# Patient Record
Sex: Female | Born: 1970 | Race: Black or African American | Hispanic: No | Marital: Single | Smoking: Never smoker
Health system: Southern US, Community
[De-identification: ages and names within clinical notes are randomized; demographics above are authoritative.]

## PROBLEM LIST (undated history)

## (undated) DIAGNOSIS — Z87440 Personal history of urinary (tract) infections: Secondary | ICD-10-CM

## (undated) DIAGNOSIS — K219 Gastro-esophageal reflux disease without esophagitis: Secondary | ICD-10-CM

## (undated) DIAGNOSIS — R030 Elevated blood-pressure reading, without diagnosis of hypertension: Secondary | ICD-10-CM

## (undated) HISTORY — DX: Personal history of urinary (tract) infections: Z87.440

## (undated) HISTORY — PX: ABLATION: SHX5711

## (undated) HISTORY — PX: DILATION AND CURETTAGE OF UTERUS: SHX78

## (undated) HISTORY — PX: OTHER SURGICAL HISTORY: SHX169

## (undated) HISTORY — PX: TUBAL LIGATION: SHX77

## (undated) HISTORY — DX: Elevated blood-pressure reading, without diagnosis of hypertension: R03.0

---

## 2000-04-26 DIAGNOSIS — O039 Complete or unspecified spontaneous abortion without complication: Secondary | ICD-10-CM

## 2000-04-26 HISTORY — DX: Complete or unspecified spontaneous abortion without complication: O03.9

## 2004-10-29 ENCOUNTER — Emergency Department: Payer: Self-pay | Admitting: Emergency Medicine

## 2007-07-31 ENCOUNTER — Emergency Department: Payer: Self-pay | Admitting: Emergency Medicine

## 2007-11-30 ENCOUNTER — Emergency Department: Payer: Self-pay | Admitting: Emergency Medicine

## 2009-01-13 ENCOUNTER — Emergency Department: Payer: Self-pay | Admitting: Emergency Medicine

## 2009-10-30 ENCOUNTER — Encounter: Payer: Self-pay | Admitting: Obstetrics and Gynecology

## 2009-12-01 ENCOUNTER — Encounter: Payer: Self-pay | Admitting: Maternal & Fetal Medicine

## 2010-01-12 ENCOUNTER — Encounter: Payer: Self-pay | Admitting: Obstetrics and Gynecology

## 2010-01-17 ENCOUNTER — Observation Stay: Payer: Self-pay | Admitting: Obstetrics & Gynecology

## 2010-01-26 ENCOUNTER — Observation Stay: Payer: Self-pay | Admitting: Obstetrics and Gynecology

## 2010-02-26 ENCOUNTER — Encounter: Payer: Self-pay | Admitting: Obstetrics and Gynecology

## 2010-03-03 ENCOUNTER — Observation Stay: Payer: Self-pay | Admitting: Obstetrics & Gynecology

## 2010-03-05 ENCOUNTER — Inpatient Hospital Stay: Payer: Self-pay | Admitting: Obstetrics and Gynecology

## 2010-03-26 ENCOUNTER — Encounter: Payer: Self-pay | Admitting: Maternal & Fetal Medicine

## 2011-03-02 ENCOUNTER — Ambulatory Visit: Payer: Self-pay | Admitting: Family Medicine

## 2012-02-19 IMAGING — US US OB >= 14 WKS - NRPT
1 series · 14 of 28 positions shown · non-contrast
Comparison: none

[Series 1: us ob >= 14 wks - nrpt · 14 of 43 slices shown]
[im 2/43]
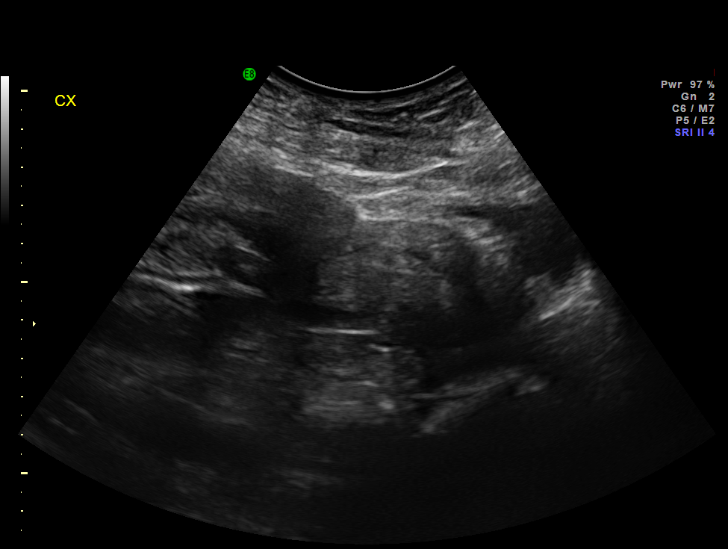
[im 5/43]
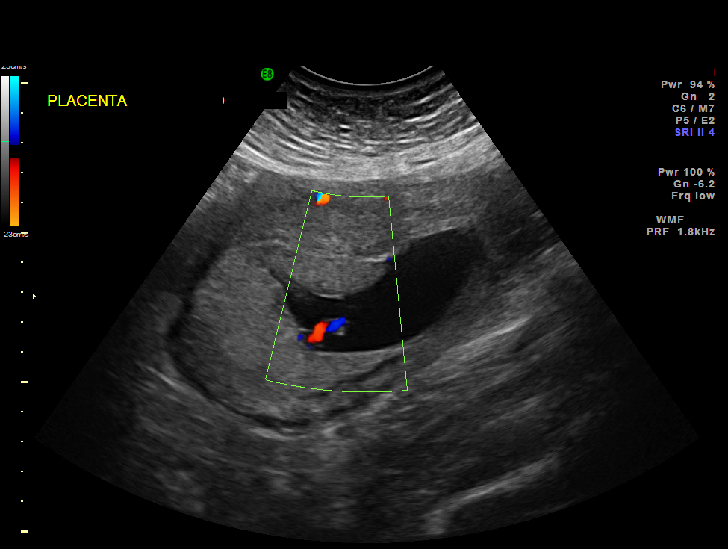
[im 8/43]
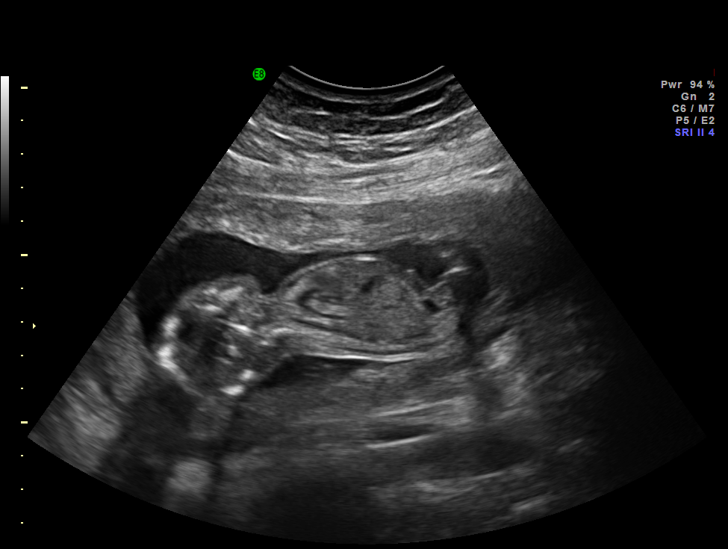
[im 11/43]
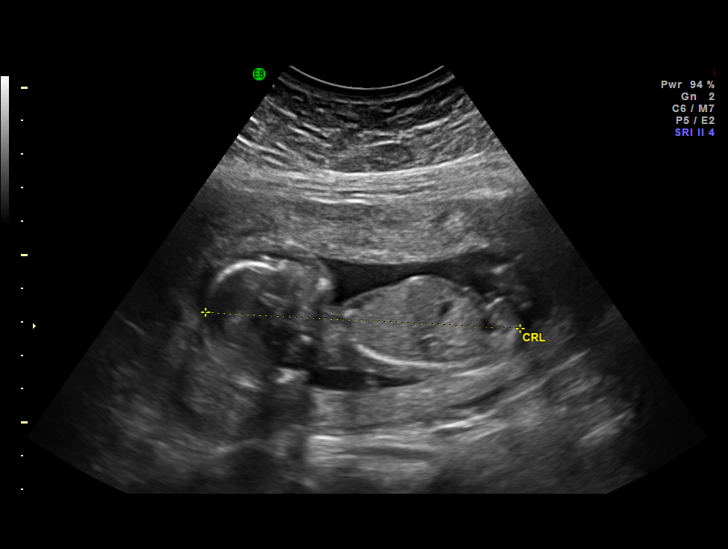
[im 15/43]
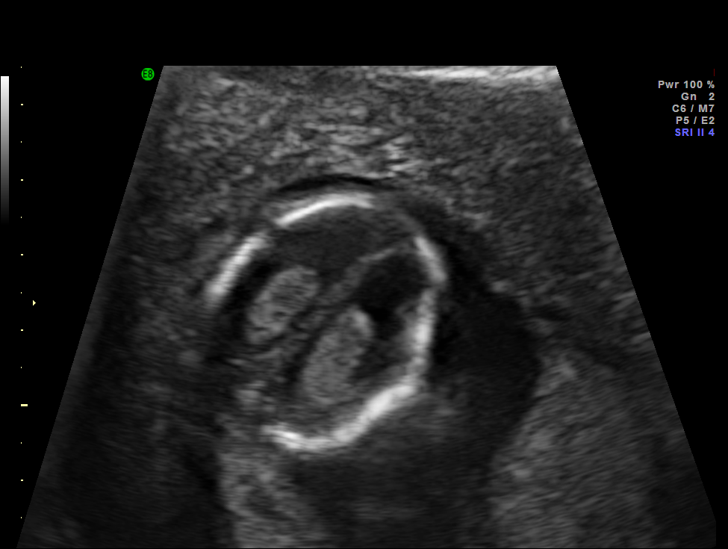
[im 18/43]
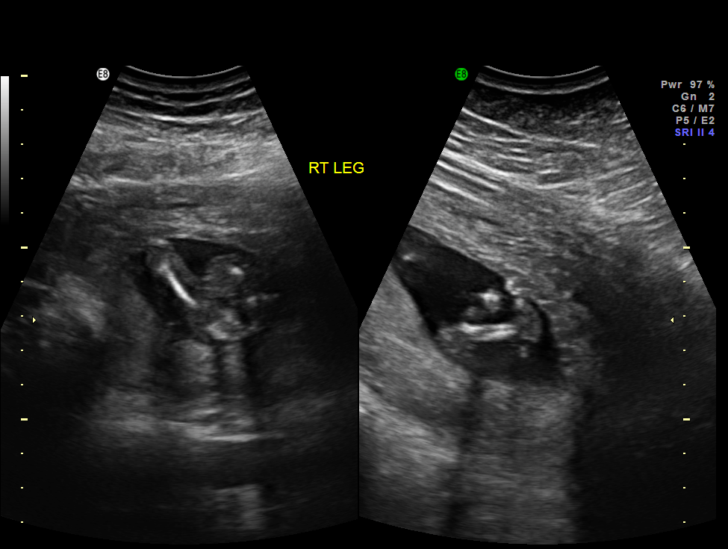
[im 21/43]
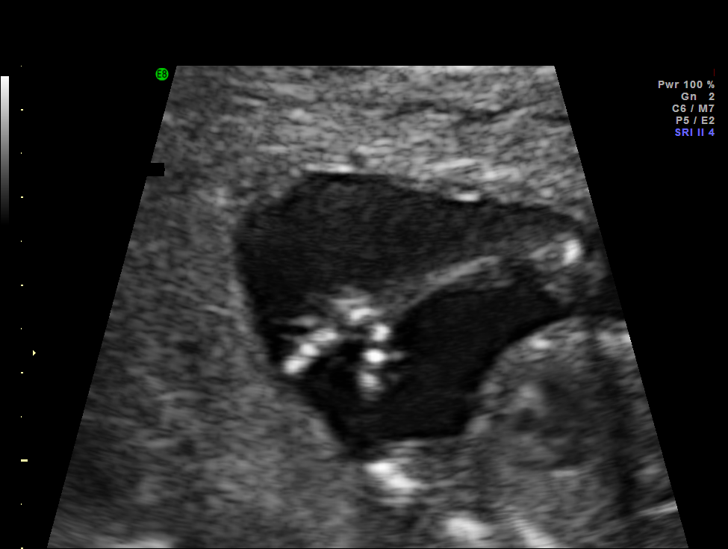
[im 24/43]
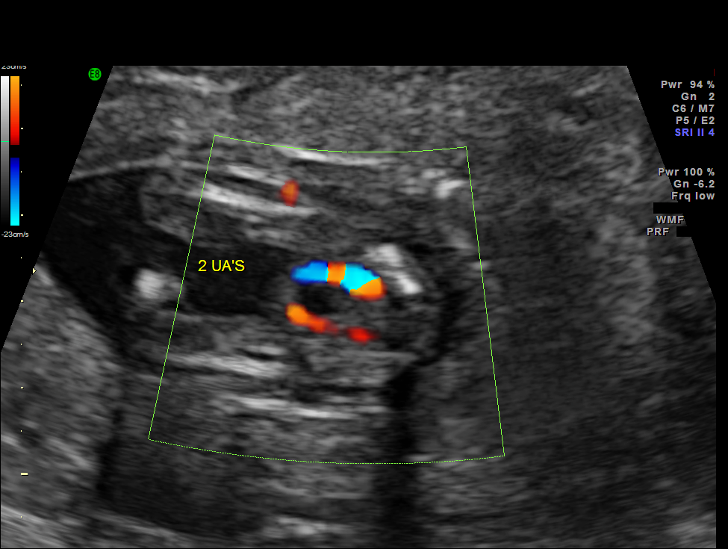
[im 27/43]
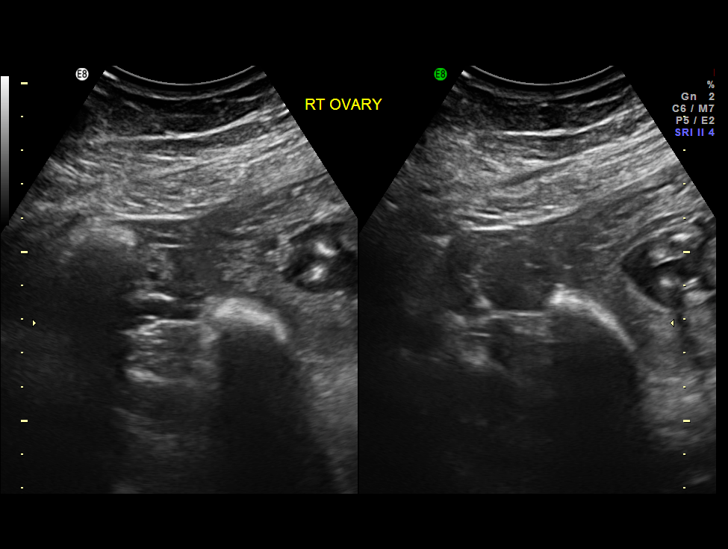
[im 30/43]
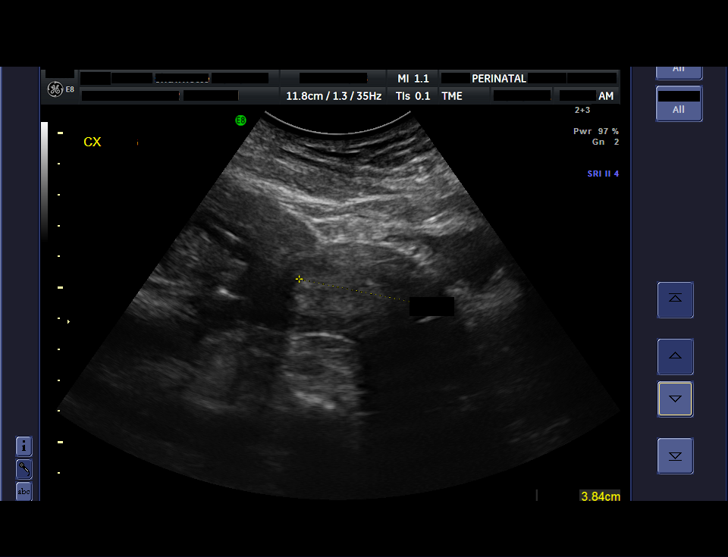
[im 33/43]
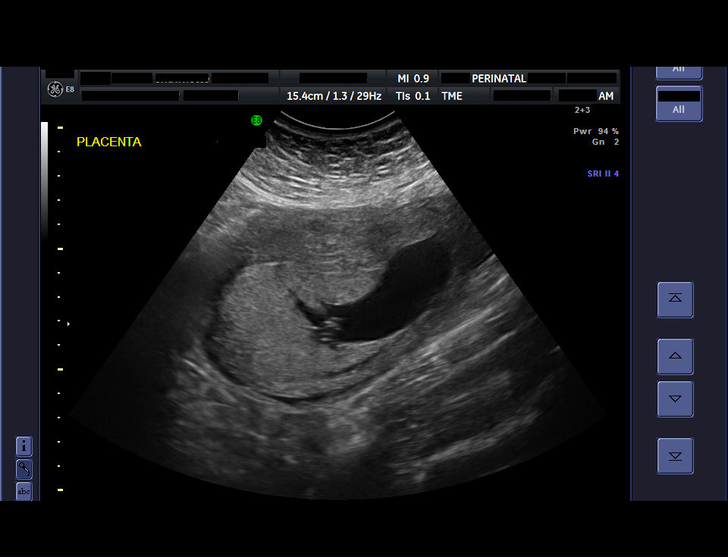
[im 36/43]
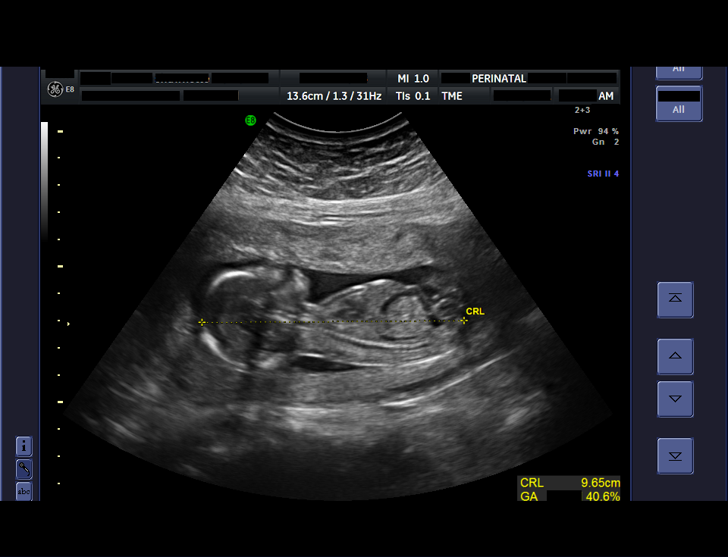
[im 39/43]
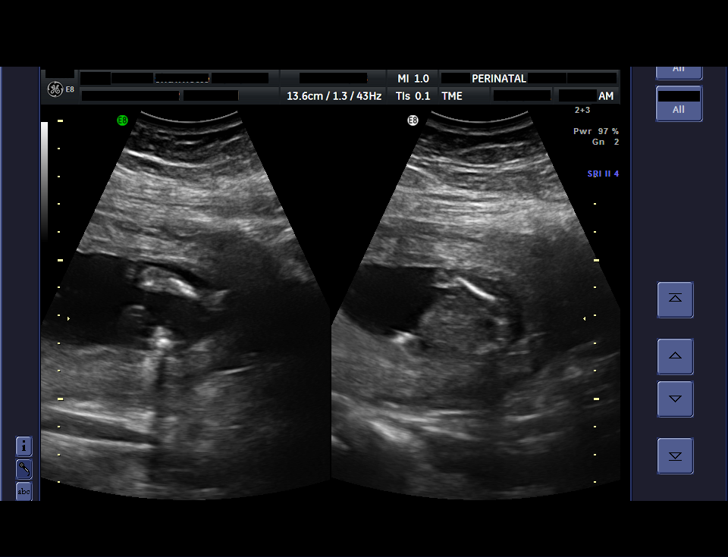
[im 43/43]
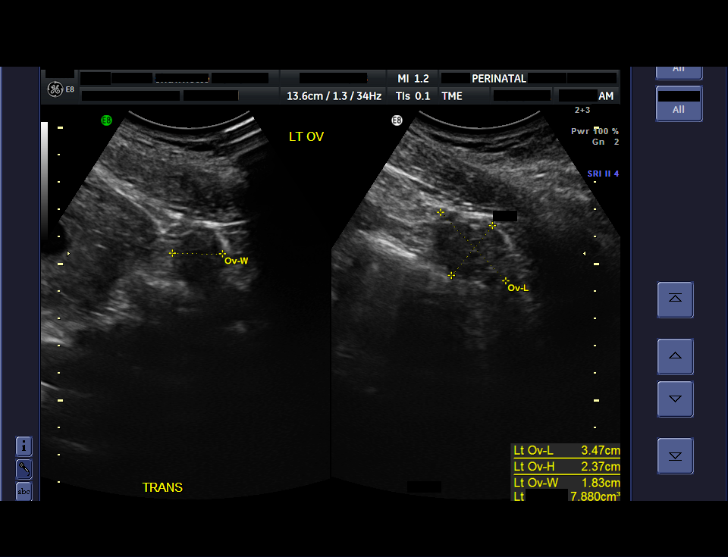

[14 of 28 positions shown; findings below may reference images not displayed]

IMAGES IMPORTED FROM THE [employer] WORKFLOW SYSTEM
NO DICTATION FOR STUDY

## 2013-09-19 ENCOUNTER — Ambulatory Visit: Payer: Self-pay | Admitting: Obstetrics and Gynecology

## 2013-09-19 LAB — CBC
HCT: 39.6 % (ref 35.0–47.0)
HGB: 12.8 g/dL (ref 12.0–16.0)
MCH: 30.9 pg (ref 26.0–34.0)
MCHC: 32.4 g/dL (ref 32.0–36.0)
MCV: 95 fL (ref 80–100)
PLATELETS: 180 10*3/uL (ref 150–440)
RBC: 4.16 10*6/uL (ref 3.80–5.20)
RDW: 13.6 % (ref 11.5–14.5)
WBC: 6 10*3/uL (ref 3.6–11.0)

## 2013-09-28 ENCOUNTER — Ambulatory Visit: Payer: Self-pay | Admitting: Obstetrics and Gynecology

## 2013-10-01 LAB — PATHOLOGY REPORT

## 2013-11-19 ENCOUNTER — Ambulatory Visit: Payer: Self-pay | Admitting: Obstetrics and Gynecology

## 2013-11-23 ENCOUNTER — Ambulatory Visit: Payer: Self-pay | Admitting: Obstetrics and Gynecology

## 2014-08-17 NOTE — Op Note (Signed)
PATIENT NAME:  Adrienne [name], Adrienne [name] MR#:  [id number] DATE OF BIRTH:  02/19/1971  DATE OF PROCEDURE:  11/23/2013  PREOPERATIVE DIAGNOSIS: Abnormal uterine bleeding.   POSTOPERATIVE DIAGNOSIS:  Abnormal uterine bleeding.  OPERATION PERFORMED: Hysteroscopy, NovaSure endometrial ablation.   ANESTHESIA USED: General.   PRIMARY SURGEON: Florina OuAndreas M. Bonney AidStaebler, MD.   PREOPERATIVE ANTIBIOTICS: None.   ESTIMATED BLOOD LOSS: Minimal.   OPERATIVE FLUIDS: 500 mL of crystalloid.   URINE OUTPUT: 50 mL of clear urine.   COMPLICATIONS: None.   INTRAOPERATIVE FINDINGS: Normal size anteverted uterus, posterior wall prolapse of the vagina, sounding length 11 cm, cervix 5.5 cm, yielding a cavity length of 5.5 cm, cavity width was noted to be 4.2 cm. The NovaSure device passed cavity assessment. Burn time at a power of 127 with 64 seconds.  Post ablation hysteroscopy revealed good coverage of the endometrial cavity with sparing of the endocervix.   SPECIMENS REMOVED: None.   CONDITION FOLLOWING PROCEDURE: Stable.   PROCEDURE IN DETAIL: Risks, benefits, and alternatives of the procedure were discussed with the patient prior to proceeding to the operating room. The patient was taken to the operating room and placed under general endotracheal anesthesia. She was positioned in the dorsal lithotomy position, prepped and draped in the usual sterile fashion. A timeout was performed. Attention was then turned to the patient'[name] pelvis. The bladder was straight catheterized with a red blood per catheter. A sterile speculum was then placed. The anterior lip of the cervix was grasped with a single-tooth tenaculum and the cervix sequentially dilated using Pratt dilators. A hysteroscope was then advanced to the cervix, revealing normal intracavitary findings.  The  hysteroscope was removed and used to measure the distance from the cervix to the internal cervical os, which was 5.5 cm. The uterus was sounded with sound to 11 cm. This  gave a cavity length of 5.5 cm A NovaSure device was placed into the endometrial cavity, deployed and measured the cavity width of 4.2 cm. The NovaSure device was activated, passed cavity assessment and was then activated for the actual ablation with a burn time of 64 seconds.   Following the ablation, a 2nd look hysteroscopy was performed noting good coverage of the entire endometrial cavity with sparing of the endocervix. The NovaSure, the sterile speculum and tenaculum were removed. The cervix was noted hemostatic. Sponge and instrument counts were correct x2. The patient tolerated the procedure well and was taken to the recovery room in stable condition.    ____________________________ Florina OuAndreas M. Bonney AidStaebler, MD ams:ds D: 11/27/2013 20:18:38 ET T: 11/27/2013 20:54:57 ET JOB#: [id number]  cc: Florina OuAndreas M. Bonney AidStaebler, MD, <Dictator> Carmel SacramentoANDREAS Cathrine MusterM Atonya Templer MD ELECTRONICALLY SIGNED 12/06/2013 11:40

## 2014-08-17 NOTE — Op Note (Signed)
PATIENT NAME:  Adrienne SalvageRONE, [name] MR#:  [id number] DATE OF BIRTH:  Mar 26, 1971  DATE OF PROCEDURE:  09/28/2013  PREOPERATIVE DIAGNOSIS: Menorrhagia and irregular-appearing endometrial lining.  POSTOPERATIVE  DIAGNOSIS: Menorrhagia and irregular-appearing endometrial lining.  OPERATION PERFORMED: Hysteroscopy, dilatation and curettage.   ANESTHESIA USED: General.   PRIMARY SURGEON: Florina OuAndreas M. Bonney AidStaebler, MD  PREOPERATIVE ANTIBIOTICS: None.   DRAINS OR TUBES: None.   IMPLANTS: None.   ESTIMATED BLOOD LOSS: Minimal.   OPERATIVE FLUIDS: 500 mL of crystalloid.   URINE OUTPUT: 200 mL of clear urine.   COMPLICATIONS: None.   INTRAOPERATIVE FINDINGS: Moderate posterior vaginal wall prolapse. Cervix was identified using vaginoscopy. Normal uterine cavity without evidence of endometrial polyps. Bilateral tubal ostia were visualized. Normal ecto- and endocervix.   SPECIMENS REMOVED: Endometrial curettings.   PATIENT CONDITION FOLLOWING PROCEDURE: Stable.   PROCEDURE IN DETAIL: Risks, benefits and alternatives of the procedure were discussed with the patient prior to proceeding to the operating room. The patient was taken to the operating room, where she was placed under general endotracheal anesthesia. She was positioned in the dorsal lithotomy position utilizing Allen stirrups, prepped and draped in the usual sterile fashion. A timeout procedure was performed. Attention was turned to the patient's pelvis. The bladder was straight catheterized for 500 mL of clear urine. An operative speculum was placed in an attempt to visualize the cervix. However, the posterior wall prolapse precluded clear visualization of the cervix. The hysteroscope was therefore used to find the cervix and was able to enter the cervix without the need of dilation. The operative speculum was then replaced around the hysteroscope, and the anterior lip of the cervix was grasped with a single-tooth tenaculum. Hysteroscopy revealed  normal findings. Following hysteroscopy, a sharp curettage was performed, yielding a moderate amount of tissue. The single-tooth tenaculum and operative speculum were then removed. Sponge, needle and instrument counts were correct x2. The patient tolerated the procedure well and left the operating room in stable condition.   ____________________________ Florina OuAndreas M. Bonney AidStaebler, MD ams:lb D: 09/29/2013 07:51:26 ET T: 09/29/2013 12:00:07 ET JOB#: [id number]  cc: Florina OuAndreas M. Bonney AidStaebler, MD, <Dictator> Carmel SacramentoANDREAS Cathrine MusterM Isrrael Fluckiger MD ELECTRONICALLY SIGNED 10/04/2013 8:30

## 2015-01-27 ENCOUNTER — Encounter: Payer: Self-pay | Admitting: Emergency Medicine

## 2015-01-27 ENCOUNTER — Emergency Department
Admission: EM | Admit: 2015-01-27 | Discharge: 2015-01-27 | Disposition: A | Discharge status: Home / Self Care | Payer: Medicaid Other | Attending: Emergency Medicine | Admitting: Emergency Medicine

## 2015-01-27 DIAGNOSIS — N390 Urinary tract infection, site not specified: Secondary | ICD-10-CM | POA: Diagnosis not present

## 2015-01-27 DIAGNOSIS — R1032 Left lower quadrant pain: Secondary | ICD-10-CM | POA: Diagnosis present

## 2015-01-27 LAB — URINALYSIS COMPLETE WITH MICROSCOPIC (ARMC ONLY)
BILIRUBIN URINE: NEGATIVE
Glucose, UA: NEGATIVE mg/dL
Ketones, ur: NEGATIVE mg/dL
NITRITE: POSITIVE — AB
PH: 7 (ref 5.0–8.0)
PROTEIN: 100 mg/dL — AB
Specific Gravity, Urine: 1.012 (ref 1.005–1.030)
Trans Epithel, UA: 1

## 2015-01-27 LAB — COMPREHENSIVE METABOLIC PANEL
ALBUMIN: 4 g/dL (ref 3.5–5.0)
ALT: 13 U/L — ABNORMAL LOW (ref 14–54)
ANION GAP: 4 — AB (ref 5–15)
AST: 18 U/L (ref 15–41)
Alkaline Phosphatase: 57 U/L (ref 38–126)
BUN: 12 mg/dL (ref 6–20)
CO2: 26 mmol/L (ref 22–32)
Calcium: 9 mg/dL (ref 8.9–10.3)
Chloride: 106 mmol/L (ref 101–111)
Creatinine, Ser: 1.06 mg/dL — ABNORMAL HIGH (ref 0.44–1.00)
GFR calc Af Amer: >60 mL/min (ref >60)
GFR calc non Af Amer: >60 mL/min (ref >60)
GLUCOSE: 100 mg/dL — AB (ref 65–99)
POTASSIUM: 3.8 mmol/L (ref 3.5–5.1)
Sodium: 136 mmol/L (ref 135–145)
Total Bilirubin: 0.7 mg/dL (ref 0.3–1.2)
Total Protein: 8.4 g/dL — ABNORMAL HIGH (ref 6.5–8.1)

## 2015-01-27 LAB — CBC WITH DIFFERENTIAL/PLATELET
Basophils Absolute: 0 10*3/uL (ref 0–0.1)
Basophils Relative: 0 %
Eosinophils Absolute: 0 10*3/uL (ref 0–0.7)
Eosinophils Relative: 0 %
HEMATOCRIT: 41.2 % (ref 35.0–47.0)
Hemoglobin: 13.7 g/dL (ref 12.0–16.0)
LYMPHS ABS: 1.8 10*3/uL (ref 1.0–3.6)
LYMPHS PCT: 16 %
MCH: 31.3 pg (ref 26.0–34.0)
MCHC: 33.3 g/dL (ref 32.0–36.0)
MCV: 93.8 fL (ref 80.0–100.0)
MONO ABS: 1 10*3/uL — AB (ref 0.2–0.9)
Monocytes Relative: 9 %
NEUTROS ABS: 8.3 10*3/uL — AB (ref 1.4–6.5)
Neutrophils Relative %: 75 %
PLATELETS: 158 10*3/uL (ref 150–440)
RBC: 4.4 MIL/uL (ref 3.80–5.20)
RDW: 13.3 % (ref 11.5–14.5)
WBC: 11.1 10*3/uL — ABNORMAL HIGH (ref 3.6–11.0)

## 2015-01-27 LAB — LIPASE, BLOOD: Lipase: 21 U/L — ABNORMAL LOW (ref 22–51)

## 2015-01-27 MED ORDER — NITROFURANTOIN MONOHYD MACRO 100 MG PO CAPS
ORAL_CAPSULE | ORAL | Status: AC
  Filled 2015-01-27: qty 1

## 2015-01-27 MED ORDER — CEFTRIAXONE SODIUM 1 G IJ SOLR
1.0000 g | Freq: Once | INTRAMUSCULAR | Status: AC
  Administered 2015-01-27: 1 g via INTRAMUSCULAR
  Filled 2015-01-27: qty 10

## 2015-01-27 MED ORDER — CEPHALEXIN 500 MG PO CAPS: 500.0000 mg | ORAL_CAPSULE | Freq: Four times a day (QID) | ORAL | Status: AC

## 2015-01-27 MED ORDER — NITROFURANTOIN MONOHYD MACRO 100 MG PO CAPS: 100.0000 mg | ORAL_CAPSULE | Freq: Two times a day (BID) | ORAL | Status: AC

## 2015-01-27 MED ORDER — CEFTRIAXONE SODIUM 1 G IJ SOLR
INTRAMUSCULAR | Status: AC
  Filled 2015-01-27: qty 10

## 2015-01-27 MED ORDER — NITROFURANTOIN MONOHYD MACRO 100 MG PO CAPS
100.0000 mg | ORAL_CAPSULE | Freq: Once | ORAL | Status: AC
  Administered 2015-01-27: 100 mg via ORAL
  Filled 2015-01-27: qty 1

## 2015-01-27 NOTE — ED Notes (Signed)
Reports LLQ pain x 2 days, pain with urination.  Just completed antibiotics (bactrium, and cipro) for uti.  Not better

## 2015-01-27 NOTE — Discharge Instructions (Signed)
Given her recent history, you appear to have a complex urinary tract infection. We discussed different antibiotic choices and agreed on the use of an injection of ceftriaxone and ongoing use of Keflex and Macrobid. Follow up with her regular doctor. You may need to see a urologist as well as. Return to emergency department if you have fever, worsening pain, or other urgent concerns.  Urinary Tract Infection A urinary tract infection (UTI) can occur any place along the urinary tract. The tract includes the kidneys, ureters, bladder, and urethra. A type of germ called bacteria often causes a UTI. UTIs are often helped with antibiotic medicine.  HOME CARE   If given, take antibiotics as told by your doctor. Finish them even if you start to feel better.  Drink enough fluids to keep your pee (urine) clear or pale yellow.  Avoid tea, drinks with caffeine, and bubbly (carbonated) drinks.  Pee often. Avoid holding your pee in for a long time.  Pee before and after having sex (intercourse).  Wipe from front to back after you poop (bowel movement) if you are a woman. Use each tissue only once. GET HELP RIGHT AWAY IF:   You have back pain.  You have lower belly (abdominal) pain.  You have chills.  You feel sick to your stomach (nauseous).  You throw up (vomit).  Your burning or discomfort with peeing does not go away.  You have a fever.  Your symptoms are not better in 3 days. MAKE SURE YOU:   Understand these instructions.  Will watch your condition.  Will get help right away if you are not doing well or get worse. Document Released: 09/29/2007 Document Revised: 01/05/2012 Document Reviewed: 11/11/2011 [facility] Patient Information 2015 [place], [place]. This information is not intended to replace advice given to you by your health care provider. Make sure you discuss any questions you have with your health care provider.

## 2015-01-27 NOTE — ED Provider Notes (Signed)
[facility] Emergency Department Provider Note  ____________________________________________  Time seen: 1555  I have reviewed the triage vital signs and the nursing notes.   HISTORY  Chief Complaint Abdominal Pain  dysuria, lower abdominal discomfort    HPI Adrienne [name] is a 44 y.o. female who presents complaining of discomfort in her left lower abdomen and feeling pressure and discomfort when she urinates. She reports it is worse today after having finished a course of Cipro this past Thursday. The Cipro was used after a short 3 day course on Bactrim. These medications were prescribed and her urinary tract infection was diagnosed by her primary physician, Dr. Letta Pate, at [facility] clinic. Through discussion with the patient, it sounds as though the urine culture showed resistance to Bactrim.  The patient reports she has had urinary tract and pelvic problems for approximately one year since she had an aberration of her uterus. She reports on again and off again in urinary tract infections and yeast infections.  The discomfort today does lateralize a little bit into the left lower abdomen and towards the left flank, but does not include the left back or upper flank. She has no fever or nausea or vomiting.     History reviewed. No pertinent past medical history.  There are no active problems to display for this patient.   Past Surgical History  Procedure Laterality Date  . Tubal ligation      Current Outpatient Rx  Name  Route  Sig  Dispense  Refill  . cephALEXin (KEFLEX) 500 MG capsule   Oral   Take 1 capsule (500 mg total) by mouth 4 (four) times daily.   40 capsule   0   . nitrofurantoin, macrocrystal-monohydrate, (MACROBID) 100 MG capsule   Oral   Take 1 capsule (100 mg total) by mouth 2 (two) times daily.   14 capsule   0     Allergies Review of patient's allergies indicates no known allergies.  History reviewed. No pertinent family  history.  Social History Social History  Substance Use Topics  . Smoking status: Never Smoker   . Smokeless tobacco: None  . Alcohol Use: Yes    Review of Systems  Constitutional: Negative for fever. ENT: Negative for sore throat. Cardiovascular: Negative for chest pain. Respiratory: Negative for shortness of breath. Gastrointestinal: Negative for abdominal pain, vomiting and diarrhea. Genitourinary: Positive for dysuria Musculoskeletal: No myalgias or injuries. Skin: Negative for rash. Neurological: Negative for headaches   10-point ROS otherwise negative.  ____________________________________________   PHYSICAL EXAM:  VITAL SIGNS: ED Triage Vitals  Enc Vitals Group     BP 01/27/15 1158 133/75 mmHg     Pulse Rate 01/27/15 1158 97     Resp 01/27/15 1158 18     Temp 01/27/15 1158 98.1 F (36.7 C)     Temp src --      SpO2 01/27/15 1158 96 %     Weight 01/27/15 1158 210 lb (95.255 kg)     Height 01/27/15 1158  (1.6 m)     Head Cir --      Peak Flow --      Pain Score 01/27/15 1521 10     Pain Loc --      Pain Edu? --      Excl. in GC? --     Constitutional: Alert and oriented. Well appearing and in no distress. ENT   Head: Normocephalic and atraumatic.   Nose: No congestion/rhinnorhea.  Mouth/Throat: Mucous membranes are moist. Cardiovascular: Normal rate, regular rhythm, no murmur noted Respiratory:  Normal respiratory effort, no tachypnea.    Breath sounds are clear and equal bilaterally.  Gastrointestinal: Soft. Mild tenderness in the left lower abdomen and left lateral torso. No distention.  Back: No muscle spasm, no tenderness, no CVA tenderness. Musculoskeletal: No deformity noted. Nontender with normal range of motion in all extremities.  No noted edema. Neurologic:  Normal speech and language. No gross focal neurologic deficits are appreciated.  Skin:  Skin is warm, dry. No rash noted. Psychiatric: Mood and affect are normal. Speech  and behavior are normal.  ____________________________________________    LABS (pertinent positives/negatives)  Labs Reviewed  URINALYSIS COMPLETEWITH MICROSCOPIC ([facility] ONLY) - Abnormal; Notable for the following:    Color, Urine YELLOW (*)    APPearance CLOUDY (*)    Hgb urine dipstick 2+ (*)    Protein, ur 100 (*)    Nitrite POSITIVE (*)    Leukocytes, UA 3+ (*)    Bacteria, UA MANY (*)    Squamous Epithelial / LPF 0-5 (*)    All other components within normal limits  COMPREHENSIVE METABOLIC PANEL - Abnormal; Notable for the following:    Glucose, Bld 100 (*)    Creatinine, Ser 1.06 (*)    Total Protein 8.4 (*)    ALT 13 (*)    Anion gap 4 (*)    All other components within normal limits  CBC WITH DIFFERENTIAL/PLATELET - Abnormal; Notable for the following:    WBC 11.1 (*)    Neutro Abs 8.3 (*)    Monocytes Absolute 1.0 (*)    All other components within normal limits  LIPASE, BLOOD - Abnormal; Notable for the following:    Lipase 21 (*)    All other components within normal limits     ____________________________________________   INITIAL IMPRESSION / ASSESSMENT AND PLAN / ED COURSE  Pertinent labs & imaging results that were available during my care of the patient were reviewed by me and considered in my medical decision making (see chart for details).   This patient has a complex urinary tract infection. It is unclear to me currently why her seven-day course of Cipro has not clear this after, I presume, a urine culture showed that it should be sensitive to Cipro. She does not have any CVA tenderness, no fever, and her white blood cell count is 11,000. She does not appear to have pyelonephritis. We will treat her with an injection of ceftriaxone, ongoing Keflex, as well as Macrobid. I discussed the need to see a urologist with the patient.  ____________________________________________   FINAL CLINICAL IMPRESSION(S) / ED DIAGNOSES  Final diagnoses:  UTI (lower  urinary tract infection)      Darien Ramus, MD 01/27/15 [phone]

## 2015-01-27 NOTE — ED Notes (Signed)
Pt reports pelvic pressure and pain upon urination. Denies fever. Denies nausea and vomiting. Denies diarrhea. Finished antibiotics four days ago, was being treated for UTI. Pt states first took Bactrim and then Cipro. Pt reports finishing all medications.

## 2015-10-20 ENCOUNTER — Other Ambulatory Visit: Payer: Self-pay | Admitting: Gastroenterology

## 2015-10-20 DIAGNOSIS — Z87898 Personal history of other specified conditions: Secondary | ICD-10-CM

## 2015-10-20 DIAGNOSIS — R1013 Epigastric pain: Secondary | ICD-10-CM

## 2015-10-22 ENCOUNTER — Ambulatory Visit
Admission: RE | Admit: 2015-10-22 | Discharge: 2015-10-22 | Disposition: A | Discharge status: Home / Self Care | Payer: Medicaid Other | Source: Ambulatory Visit | Attending: Gastroenterology | Admitting: Gastroenterology

## 2015-10-22 DIAGNOSIS — Z87898 Personal history of other specified conditions: Secondary | ICD-10-CM

## 2015-10-22 DIAGNOSIS — R1013 Epigastric pain: Secondary | ICD-10-CM | POA: Diagnosis not present

## 2016-01-12 ENCOUNTER — Encounter: Admission: RE | Payer: Self-pay | Source: Ambulatory Visit

## 2016-01-12 ENCOUNTER — Ambulatory Visit: Admission: RE | Admit: 2016-01-12 | Payer: Medicaid Other | Source: Ambulatory Visit | Admitting: Gastroenterology

## 2016-01-12 HISTORY — DX: Gastro-esophageal reflux disease without esophagitis: K21.9

## 2016-01-12 SURGERY — ESOPHAGOGASTRODUODENOSCOPY (EGD) WITH PROPOFOL
Anesthesia: General

## 2016-06-11 ENCOUNTER — Emergency Department
Admission: EM | Admit: 2016-06-11 | Discharge: 2016-06-11 | Disposition: A | Discharge status: Home / Self Care | Payer: Medicaid Other | Attending: Emergency Medicine | Admitting: Emergency Medicine

## 2016-06-11 ENCOUNTER — Encounter: Payer: Self-pay | Admitting: Emergency Medicine

## 2016-06-11 DIAGNOSIS — M7602 Gluteal tendinitis, left hip: Secondary | ICD-10-CM | POA: Insufficient documentation

## 2016-06-11 MED ORDER — IBUPROFEN 600 MG PO TABS: 600.0000 mg | ORAL_TABLET | Freq: Three times a day (TID) | ORAL | 0 refills | Status: DC | PRN

## 2016-06-11 MED ORDER — CYCLOBENZAPRINE HCL 10 MG PO TABS: 10.0000 mg | ORAL_TABLET | Freq: Three times a day (TID) | ORAL | 0 refills | Status: DC | PRN

## 2016-06-11 MED ORDER — TRAMADOL HCL 50 MG PO TABS
50.0000 mg | ORAL_TABLET | Freq: Four times a day (QID) | ORAL | 0 refills | Status: AC | PRN
Start: 2016-06-11 — End: 2017-06-11

## 2016-06-11 NOTE — ED Provider Notes (Signed)
[facility] Emergency Department Provider Note   ____________________________________________   None    (approximate)  I have reviewed the triage vital signs and the nursing notes.   HISTORY  Chief Complaint Rectal Pain    HPI Adrienne [name] is a 46 y.o. female patient complain of left buttock pain for 2 weeks. Patient state is tender When she rolled out of bed has not improved. Patient's using over-the-counter icy hot and anti-inflammatory medications. Patient denies any radicular component to this pain. Patient stated pain increases with sitting and return to a standing position. She rates the pain as a 10 over 10. Patient describes pain as a constant ache that increases to sharp with ambulation.   Past Medical History:  Diagnosis Date  . GERD (gastroesophageal reflux disease)     There are no active problems to display for this patient.   Past Surgical History:  Procedure Laterality Date  . ABLATION    . CESAREAN SECTION    . DILATION AND CURETTAGE OF UTERUS    . TUBAL LIGATION      Prior to Admission medications   Medication Sig Start Date End Date Taking? Authorizing Provider  cyclobenzaprine (FLEXERIL) 10 MG tablet Take 1 tablet (10 mg total) by mouth 3 (three) times daily as needed. 06/11/16   Joni Reiningonald K Abbey Veith, PA-C  ibuprofen (ADVIL,MOTRIN) 600 MG tablet Take 1 tablet (600 mg total) by mouth every 8 (eight) hours as needed. 06/11/16   Joni Reiningonald K Carmita Boom, PA-C  medroxyPROGESTERone (DEPO-PROVERA) 150 MG/ML injection Inject 150 mg into the muscle every 3 (three) months.    Historical Provider, MD  RABEprazole (ACIPHEX) 20 MG tablet Take 20 mg by mouth 2 (two) times daily before a meal.    Historical Provider, MD  sucralfate (CARAFATE) 1 g tablet Take 1 g by mouth 4 (four) times daily.    Historical Provider, MD  traMADol (ULTRAM) 50 MG tablet Take 1 tablet (50 mg total) by mouth every 6 (six) hours as needed. 06/11/16 06/11/17  Joni Reiningonald K Caelan Branden, PA-C     Allergies Patient has no known allergies.  No family history on file.  Social History Social History  Substance Use Topics  . Smoking status: Never Smoker  . Smokeless tobacco: Never Used  . Alcohol use Yes    Review of Systems Constitutional: No fever/chills Eyes: No visual changes. ENT: No sore throat. Cardiovascular: Denies chest pain. Respiratory: Denies shortness of breath. Gastrointestinal: No abdominal pain.  No nausea, no vomiting.  No diarrhea.  No constipation. Genitourinary: Negative for dysuria. Musculoskeletal:Buttocks pain Skin: Negative for rash. Neurological: Negative for headaches, focal weakness or numbness.    ____________________________________________   PHYSICAL EXAM:  VITAL SIGNS: ED Triage Vitals  Enc Vitals Group     BP 06/11/16 0714 (!) 148/103     Pulse Rate 06/11/16 0714 81     Resp 06/11/16 0714 18     Temp 06/11/16 0714 98.7 F (37.1 C)     Temp Source 06/11/16 0714 Oral     SpO2 06/11/16 0714 95 %     Weight 06/11/16 0710 208 lb (94.3 kg)     Height 06/11/16 0710 5\' 3"  (1.6 m)     Head Circumference --      Peak Flow --      Pain Score 06/11/16 0710 10     Pain Loc --      Pain Edu? --      Excl. in GC? --  Constitutional: Alert and oriented. Well appearing and in no acute distress. Eyes: Conjunctivae are normal. PERRL. EOMI. Head: Atraumatic. Nose: No congestion/rhinnorhea. Mouth/Throat: Mucous membranes are moist.  Oropharynx non-erythematous. Neck: No stridor.  No cervical spine tenderness to palpation. Hematological/Lymphatic/Immunilogical: No cervical lymphadenopathy. Cardiovascular: Normal rate, regular rhythm. Grossly normal heart sounds.  Good peripheral circulation. Respiratory: Normal respiratory effort.  No retractions. Lungs CTAB. Gastrointestinal: Soft and nontender. No distention. No abdominal bruits. No CVA tenderness. Musculoskeletal: Patient is moderate guarding palpation left lateral aspect of the  buttocks. Neurologic:  Normal speech and language. No gross focal neurologic deficits are appreciated. No gait instability. Skin:  Skin is warm, dry and intact. No rash noted. Psychiatric: Mood and affect are normal. Speech and behavior are normal.  ____________________________________________   LABS (all labs ordered are listed, but only abnormal results are displayed)  Labs Reviewed - No data to display ____________________________________________  EKG   ____________________________________________  RADIOLOGY   ____________________________________________   PROCEDURES  Procedure(s) performed: None  Procedures  Critical Care performed: No  ____________________________________________   INITIAL IMPRESSION / ASSESSMENT AND PLAN / ED COURSE  Pertinent labs & imaging results that were available during my care of the patient were reviewed by me and considered in my medical decision making (see chart for details).  Gluteal tendonitis. Patient given discharge care instruction. Patient get a prescription for Vicoprofen and Flexeril. Patient advised follow-up with family doctor condition persists.      ____________________________________________   FINAL CLINICAL IMPRESSION(S) / ED DIAGNOSES  Final diagnoses:  Gluteal tendonitis of left buttock      NEW MEDICATIONS STARTED DURING THIS VISIT:  Discharge Medication List as of 06/11/2016  8:08 AM    START taking these medications   Details  cyclobenzaprine (FLEXERIL) 10 MG tablet Take 1 tablet (10 mg total) by mouth 3 (three) times daily as needed., Starting Fri 06/11/2016, Print    ibuprofen (ADVIL,MOTRIN) 600 MG tablet Take 1 tablet (600 mg total) by mouth every 8 (eight) hours as needed., Starting Fri 06/11/2016, Print    traMADol (ULTRAM) 50 MG tablet Take 1 tablet (50 mg total) by mouth every 6 (six) hours as needed., Starting Fri 06/11/2016, Until Sat 06/11/2017, Print         Note:  This document was  prepared using Dragon voice recognition software and may include unintentional dictation errors.    Joni Reining, PA-C 06/12/16 1818    Charlynne Pander, MD 06/14/16 [phone]

## 2016-06-11 NOTE — ED Triage Notes (Signed)
Patient states, "I think I pulled a muscle in my butt."  Patient reports pain x 2 weeks.  Patient states, "at first I thought I rolled off the bed wrong but it's not getting any better."  Patient points to her left thigh area when describes the pain.  Patient ambulatory to triage with no obvious distress at this time.

## 2016-06-23 LAB — HM PAP SMEAR: HM Pap smear: NEGATIVE

## 2017-08-23 ENCOUNTER — Encounter: Payer: Self-pay | Admitting: Emergency Medicine

## 2017-08-23 ENCOUNTER — Other Ambulatory Visit: Payer: Self-pay

## 2017-08-23 ENCOUNTER — Emergency Department
Admission: EM | Admit: 2017-08-23 | Discharge: 2017-08-23 | Disposition: A | Discharge status: Home / Self Care | Payer: Managed Care, Other (non HMO) | Attending: Emergency Medicine | Admitting: Emergency Medicine

## 2017-08-23 DIAGNOSIS — Z79899 Other long term (current) drug therapy: Secondary | ICD-10-CM | POA: Insufficient documentation

## 2017-08-23 DIAGNOSIS — R197 Diarrhea, unspecified: Secondary | ICD-10-CM

## 2017-08-23 LAB — DIFFERENTIAL
BASOS ABS: 0 10*3/uL (ref 0–0.1)
BLASTS: 0 %
Band Neutrophils: 2 %
Basophils Relative: 0 %
EOS PCT: 4 %
Eosinophils Absolute: 0.1 10*3/uL (ref 0–0.7)
LYMPHS ABS: 1.7 10*3/uL (ref 1.0–3.6)
LYMPHS PCT: 53 %
MONO ABS: 0.7 10*3/uL (ref 0.2–0.9)
MYELOCYTES: 0 %
Metamyelocytes Relative: 0 %
Monocytes Relative: 24 %
Neutro Abs: 0.6 10*3/uL — ABNORMAL LOW (ref 1.4–6.5)
Neutrophils Relative %: 17 %
Other: 0 %
PROMYELOCYTES RELATIVE: 0 %
nRBC: 0 /100 WBC

## 2017-08-23 LAB — CBC
HEMATOCRIT: 45.7 % (ref 35.0–47.0)
HEMOGLOBIN: 15.6 g/dL (ref 12.0–16.0)
MCH: 32 pg (ref 26.0–34.0)
MCHC: 34.1 g/dL (ref 32.0–36.0)
MCV: 93.9 fL (ref 80.0–100.0)
Platelets: 196 10*3/uL (ref 150–440)
RBC: 4.86 MIL/uL (ref 3.80–5.20)
RDW: 13.5 % (ref 11.5–14.5)
WBC: 3.1 10*3/uL — ABNORMAL LOW (ref 3.6–11.0)

## 2017-08-23 LAB — URINALYSIS, COMPLETE (UACMP) WITH MICROSCOPIC
Bilirubin Urine: NEGATIVE
Glucose, UA: NEGATIVE mg/dL
KETONES UR: 5 mg/dL — AB
Leukocytes, UA: NEGATIVE
Nitrite: NEGATIVE
Protein, ur: 30 mg/dL — AB
Specific Gravity, Urine: 1.025 (ref 1.005–1.030)
pH: 5 (ref 5.0–8.0)

## 2017-08-23 LAB — COMPREHENSIVE METABOLIC PANEL
ALBUMIN: 4 g/dL (ref 3.5–5.0)
ALT: 22 U/L (ref 14–54)
ANION GAP: 7 (ref 5–15)
AST: 23 U/L (ref 15–41)
Alkaline Phosphatase: 68 U/L (ref 38–126)
BUN: 9 mg/dL (ref 6–20)
CHLORIDE: 104 mmol/L (ref 101–111)
CO2: 22 mmol/L (ref 22–32)
Calcium: 8.7 mg/dL — ABNORMAL LOW (ref 8.9–10.3)
Creatinine, Ser: 1 mg/dL (ref 0.44–1.00)
GFR calc Af Amer: >60 mL/min (ref >60)
GFR calc non Af Amer: >60 mL/min (ref >60)
Glucose, Bld: 119 mg/dL — ABNORMAL HIGH (ref 65–99)
POTASSIUM: 3.4 mmol/L — AB (ref 3.5–5.1)
SODIUM: 133 mmol/L — AB (ref 135–145)
Total Bilirubin: 0.4 mg/dL (ref 0.3–1.2)
Total Protein: 8.3 g/dL — ABNORMAL HIGH (ref 6.5–8.1)

## 2017-08-23 LAB — LIPASE, BLOOD: Lipase: 30 U/L (ref 11–51)

## 2017-08-23 MED ORDER — ONDANSETRON 4 MG PO TBDP: 4.0000 mg | ORAL_TABLET | Freq: Three times a day (TID) | ORAL | 0 refills | Status: DC | PRN

## 2017-08-23 MED ORDER — SODIUM CHLORIDE 0.9 % IV BOLUS
1000.0000 mL | Freq: Once | INTRAVENOUS | Status: AC
  Administered 2017-08-23: 1000 mL via INTRAVENOUS

## 2017-08-23 MED ORDER — KETOROLAC TROMETHAMINE 30 MG/ML IJ SOLN
15.0000 mg | Freq: Once | INTRAMUSCULAR | Status: AC
Start: 2017-08-23 — End: 2017-08-23
  Administered 2017-08-23: 15 mg via INTRAVENOUS
  Filled 2017-08-23: qty 1

## 2017-08-23 NOTE — ED Notes (Signed)
Patient to room 9 ambulatory.

## 2017-08-23 NOTE — ED Provider Notes (Signed)
[name]  ____________________________________________  Time seen: Approximately 11:52 AM  I have reviewed the triage vital signs and the nursing notes.   HISTORY  Chief Complaint Abdominal Pain and Diarrhea   HPI Adrienne [name] is a 47 y.o. female with history of GERD and chronic abdominal pain who presents for abdominal pain and diarrhea.  Patient reports that her symptoms started 2 days ago.  She had several episodes of watery diarrhea and diffuse cramping abdominal pain.  Yesterday the diarrhea resolved but the abdominal cramping persistent.  The pain is worse every time she eats anything.  She had one episode of vomiting 2 days ago but that has also resolved.  Today she reports that the diarrhea restarted.  She reports 4-6 episodes only today of watery diarrhea.  She has had nausea.  No vomiting, no fever but has had chills.  No recent antibiotic use, no history of C. Difficile.  Patient denies any prior abdominal surgeries.  At this time she is complaining of 10 out of 10 diffuse crampy abdominal pain.  Past Medical History:  Diagnosis Date  . GERD (gastroesophageal reflux disease)      Past Surgical History:  Procedure Laterality Date  . ABLATION    . CESAREAN SECTION    . DILATION AND CURETTAGE OF UTERUS    . TUBAL LIGATION      Prior to Admission medications   Medication Sig Start Date End Date Taking? Authorizing Provider  cyclobenzaprine (FLEXERIL) 10 MG tablet Take 1 tablet (10 mg total) by mouth 3 (three) times daily as needed. 06/11/16   Joni Reining, PA-C  ibuprofen (ADVIL,MOTRIN) 600 MG tablet Take 1 tablet (600 mg total) by mouth every 8 (eight) hours as needed. 06/11/16   Joni Reining, PA-C  medroxyPROGESTERone (DEPO-PROVERA) 150 MG/ML injection Inject 150 mg into the muscle every 3 (three) months.    [provider]  ondansetron (ZOFRAN ODT) 4 MG disintegrating tablet Take 1 tablet (4  mg total) by mouth every 8 (eight) hours as needed for nausea or vomiting. 08/23/17   Don Perking, [place], MD  RABEprazole (ACIPHEX) 20 MG tablet Take 20 mg by mouth 2 (two) times daily before a meal.    [provider]  sucralfate (CARAFATE) 1 g tablet Take 1 g by mouth 4 (four) times daily.    [provider]    Allergies Patient has no known allergies.  No family history on file.  Social History Social History   Tobacco Use  . Smoking status: Never Smoker  . Smokeless tobacco: Never Used  Substance Use Topics  . Alcohol use: Yes  . Drug use: No    Review of Systems  Constitutional: Negative for fever. + chills Eyes: Negative for visual changes. ENT: Negative for sore throat. Neck: No neck pain  Cardiovascular: Negative for chest pain. Respiratory: Negative for shortness of breath. Gastrointestinal: + abdominal pain, vomiting and diarrhea. Genitourinary: Negative for dysuria. Musculoskeletal: Negative for back pain. Skin: Negative for rash. Neurological: Negative for headaches, weakness or numbness. Psych: No SI or HI  ____________________________________________   PHYSICAL EXAM:  VITAL SIGNS: ED Triage Vitals  Enc Vitals Group     BP 08/23/17 0911 (!) 129/93     Pulse Rate 08/23/17 0911 91     Resp 08/23/17 0911 18     Temp 08/23/17 0911 98.1 F (36.7 C)     Temp Source 08/23/17 0911 Oral     SpO2 08/23/17  0911 97 %     Weight --      Height --      Head Circumference --      Peak Flow --      Pain Score 08/23/17 0915 10     Pain Loc --      Pain Edu? --      Excl. in GC? --     Constitutional: Alert and oriented. Well appearing and in no apparent distress. HEENT:      Head: Normocephalic and atraumatic.         Eyes: Conjunctivae are normal. Sclera is non-icteric.       Mouth/Throat: Mucous membranes are moist.       Neck: Supple with no signs of meningismus. Cardiovascular: Regular rate and rhythm. No murmurs, gallops, or rubs.  2+ symmetrical distal pulses are present in all extremities. No JVD. Respiratory: Normal respiratory effort. Lungs are clear to auscultation bilaterally. No wheezes, crackles, or rhonchi.  Gastrointestinal: Soft, tender to palpation mostly on the left quadrants and suprapubic region, and non distended with positive bowel sounds. No rebound or guarding. Genitourinary: No CVA tenderness. Musculoskeletal: Nontender with normal range of motion in all extremities. No edema, cyanosis, or erythema of extremities. Neurologic: Normal speech and language. Face is symmetric. Moving all extremities. No gross focal neurologic deficits are appreciated. Skin: Skin is warm, dry and intact. No rash noted. Psychiatric: Mood and affect are normal. Speech and behavior are normal.  ____________________________________________   LABS (all labs ordered are listed, but only abnormal results are displayed)  Labs Reviewed  COMPREHENSIVE METABOLIC PANEL - Abnormal; Notable for the following components:      Result Value   Sodium 133 (*)    Potassium 3.4 (*)    Glucose, Bld 119 (*)    Calcium 8.7 (*)    Total Protein 8.3 (*)    All other components within normal limits  CBC - Abnormal; Notable for the following components:   WBC 3.1 (*)    All other components within normal limits  URINALYSIS, COMPLETE (UACMP) WITH MICROSCOPIC - Abnormal; Notable for the following components:   Color, Urine YELLOW (*)    APPearance HAZY (*)    Hgb urine dipstick SMALL (*)    Ketones, ur 5 (*)    Protein, ur 30 (*)    Bacteria, UA FEW (*)    All other components within normal limits  DIFFERENTIAL - Abnormal; Notable for the following components:   Neutro Abs 0.6 (*)    All other components within normal limits  URINE CULTURE  C DIFFICILE QUICK SCREEN W PCR REFLEX  LIPASE, BLOOD   ____________________________________________  EKG  none ____________________________________________  RADIOLOGY  none    ____________________________________________   PROCEDURES  Procedure(s) performed: None Procedures Critical Care performed:  None ____________________________________________   INITIAL IMPRESSION / ASSESSMENT AND PLAN / ED COURSE  47 y.o. female with history of GERD and chronic abdominal pain who presents for abdominal pain and diarrhea x 2 days.  Patient is extremely well-appearing, no distress, has normal vital signs, abdomen is soft with mild diffuse tenderness worse on the left quadrants, no Murphy sign, no right lower quadrant tenderness, no rebound or guarding.  Labs showed leukopenia with WBC 3.1. Differential is pending. Normal CMP and lipase. UA showing 5 ketones, few bacteria.  Patient denies any UTI symptoms.  Will wait until culture is back.  Will give fluids, Toradol, and Zofran.  Presentation concerning for viral gastroenteritis.  Will send  for C. difficile if patient has a bowel movement in the emergency room.    _________________________ 2:03 PM on 08/23/2017 -----------------------------------------  Patient feels markedly improved after fluids, Toradol and Zofran.  Abdomen is soft with no tenderness.  Has been in the ED for 5 hours with no further episodes of diarrhea.  She is tolerating p.o.  Will discharge home on Zofran, follow-up with primary care doctor, and supportive care.  As part of my medical decision making, I reviewed the following data within the electronic MEDICAL RECORD NUMBER Nursing notes reviewed and incorporated, Labs reviewed , Notes from prior ED visits and [place] Controlled Substance Database    Pertinent labs & imaging results that were available during my care of the patient were reviewed by me and considered in my medical decision making (see chart for details).    ____________________________________________   FINAL CLINICAL IMPRESSION(S) / ED DIAGNOSES  Final diagnoses:  Diarrhea of presumed infectious origin      NEW MEDICATIONS STARTED  DURING THIS VISIT:  ED Discharge Orders        Ordered    ondansetron (ZOFRAN ODT) 4 MG disintegrating tablet  Every 8 hours PRN     08/23/17 1402       Note:  This document was prepared using Dragon voice recognition software and may include unintentional dictation errors.    Don Perking, [place], MD 08/23/17 [phone]

## 2017-08-23 NOTE — ED Notes (Signed)
Dr. Veronese at bedside 

## 2017-08-23 NOTE — ED Triage Notes (Signed)
Pt to ED via POV c/o abd pain and diarrhea since Sunday. Pt states that when she tries to eat the pain gets worse. Pt denies N/V. Pt is in NAD at this time.

## 2017-08-24 LAB — URINE CULTURE

## 2017-12-05 ENCOUNTER — Emergency Department
Admission: EM | Admit: 2017-12-05 | Discharge: 2017-12-05 | Disposition: A | Discharge status: Home / Self Care | Payer: Managed Care, Other (non HMO) | Attending: Student in an Organized Health Care Education/Training Program | Admitting: Student in an Organized Health Care Education/Training Program

## 2017-12-05 ENCOUNTER — Other Ambulatory Visit: Payer: Self-pay

## 2017-12-05 ENCOUNTER — Encounter: Payer: Self-pay | Admitting: Emergency Medicine

## 2017-12-05 DIAGNOSIS — Y929 Unspecified place or not applicable: Secondary | ICD-10-CM | POA: Diagnosis not present

## 2017-12-05 DIAGNOSIS — S3992XA Unspecified injury of lower back, initial encounter: Secondary | ICD-10-CM | POA: Diagnosis present

## 2017-12-05 DIAGNOSIS — X500XXA Overexertion from strenuous movement or load, initial encounter: Secondary | ICD-10-CM | POA: Diagnosis not present

## 2017-12-05 DIAGNOSIS — Y999 Unspecified external cause status: Secondary | ICD-10-CM | POA: Diagnosis not present

## 2017-12-05 DIAGNOSIS — Y9389 Activity, other specified: Secondary | ICD-10-CM | POA: Diagnosis not present

## 2017-12-05 DIAGNOSIS — S39012A Strain of muscle, fascia and tendon of lower back, initial encounter: Secondary | ICD-10-CM

## 2017-12-05 DIAGNOSIS — B372 Candidiasis of skin and nail: Secondary | ICD-10-CM | POA: Insufficient documentation

## 2017-12-05 DIAGNOSIS — Z79899 Other long term (current) drug therapy: Secondary | ICD-10-CM | POA: Diagnosis not present

## 2017-12-05 MED ORDER — HYDROCODONE-ACETAMINOPHEN 5-325 MG PO TABS: 1.0000 | ORAL_TABLET | Freq: Four times a day (QID) | ORAL | 0 refills | Status: DC | PRN

## 2017-12-05 MED ORDER — METHOCARBAMOL 500 MG PO TABS
1000.0000 mg | ORAL_TABLET | Freq: Once | ORAL | Status: AC
  Administered 2017-12-05: 1000 mg via ORAL
  Filled 2017-12-05: qty 2

## 2017-12-05 MED ORDER — KETOROLAC TROMETHAMINE 30 MG/ML IJ SOLN
30.0000 mg | Freq: Once | INTRAMUSCULAR | Status: AC
  Administered 2017-12-05: 30 mg via INTRAMUSCULAR
  Filled 2017-12-05: qty 1

## 2017-12-05 MED ORDER — METHOCARBAMOL 500 MG PO TABS: 500.0000 mg | ORAL_TABLET | Freq: Four times a day (QID) | ORAL | 0 refills | Status: DC | PRN

## 2017-12-05 MED ORDER — CLOTRIMAZOLE 1 % EX CREA: 1.0000 "application " | TOPICAL_CREAM | Freq: Two times a day (BID) | CUTANEOUS | 0 refills | Status: DC

## 2017-12-05 MED ORDER — IBUPROFEN 600 MG PO TABS: 600.0000 mg | ORAL_TABLET | Freq: Three times a day (TID) | ORAL | 0 refills | Status: DC | PRN

## 2017-12-05 NOTE — ED Notes (Signed)
See triage note  Presents with 2-3 days of lower back pain  States pain is non radiating  Also having a red sore area to lower abd area   No fever

## 2017-12-05 NOTE — Discharge Instructions (Addendum)
Follow-up with your primary care provider if any continued problems.  Use moist heat or ice to your lower back as needed for discomfort.  Begin taking medication as directed.  Also use cream to your rash until completely finished which may take up to 2 to 3 weeks.  Allow area to get plenty of air.
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
## 2017-12-05 NOTE — ED Triage Notes (Signed)
Back pain x 2 days, denies injury. Irritation old scar lower abd. (post c section 7 years.)

## 2018-06-22 ENCOUNTER — Emergency Department
Admission: EM | Admit: 2018-06-22 | Discharge: 2018-06-22 | Disposition: A | Discharge status: Home / Self Care | Payer: Managed Care, Other (non HMO) | Attending: Student in an Organized Health Care Education/Training Program | Admitting: Student in an Organized Health Care Education/Training Program

## 2018-06-22 ENCOUNTER — Encounter: Payer: Self-pay | Admitting: Emergency Medicine

## 2018-06-22 DIAGNOSIS — Y998 Other external cause status: Secondary | ICD-10-CM | POA: Insufficient documentation

## 2018-06-22 DIAGNOSIS — Y92410 Unspecified street and highway as the place of occurrence of the external cause: Secondary | ICD-10-CM | POA: Diagnosis not present

## 2018-06-22 DIAGNOSIS — Y9389 Activity, other specified: Secondary | ICD-10-CM | POA: Insufficient documentation

## 2018-06-22 DIAGNOSIS — M545 Low back pain: Secondary | ICD-10-CM | POA: Insufficient documentation

## 2018-06-22 MED ORDER — CYCLOBENZAPRINE HCL 10 MG PO TABS: 10.0000 mg | ORAL_TABLET | Freq: Three times a day (TID) | ORAL | 0 refills | Status: DC | PRN

## 2018-06-22 MED ORDER — OXYCODONE-ACETAMINOPHEN 5-325 MG PO TABS
1.0000 | ORAL_TABLET | Freq: Once | ORAL | Status: AC
  Administered 2018-06-22: 1 via ORAL
  Filled 2018-06-22: qty 1

## 2018-06-22 MED ORDER — IBUPROFEN 600 MG PO TABS
600.0000 mg | ORAL_TABLET | Freq: Once | ORAL | Status: AC
  Administered 2018-06-22: 600 mg via ORAL
  Filled 2018-06-22: qty 1

## 2018-06-22 MED ORDER — TRAMADOL HCL 50 MG PO TABS: 50.0000 mg | ORAL_TABLET | Freq: Four times a day (QID) | ORAL | 0 refills | Status: AC | PRN

## 2018-06-22 MED ORDER — IBUPROFEN 600 MG PO TABS: 600.0000 mg | ORAL_TABLET | Freq: Three times a day (TID) | ORAL | 0 refills | Status: DC | PRN

## 2018-06-22 MED ORDER — CYCLOBENZAPRINE HCL 10 MG PO TABS
10.0000 mg | ORAL_TABLET | Freq: Once | ORAL | Status: AC
  Administered 2018-06-22: 10 mg via ORAL
  Filled 2018-06-22: qty 1
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
## 2018-06-22 NOTE — ED Notes (Signed)
Pt discharged home after verbalizing understanding of discharge instructions; nad noted. 
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
## 2018-11-08 ENCOUNTER — Encounter: Payer: Self-pay | Admitting: Emergency Medicine

## 2018-11-08 ENCOUNTER — Emergency Department
Admission: EM | Admit: 2018-11-08 | Discharge: 2018-11-08 | Disposition: A | Discharge status: Home / Self Care | Payer: Managed Care, Other (non HMO) | Attending: Emergency Medicine | Admitting: Emergency Medicine

## 2018-11-08 ENCOUNTER — Other Ambulatory Visit: Payer: Self-pay

## 2018-11-08 DIAGNOSIS — R42 Dizziness and giddiness: Secondary | ICD-10-CM | POA: Insufficient documentation

## 2018-11-08 DIAGNOSIS — K219 Gastro-esophageal reflux disease without esophagitis: Secondary | ICD-10-CM | POA: Insufficient documentation

## 2018-11-08 DIAGNOSIS — Z20828 Contact with and (suspected) exposure to other viral communicable diseases: Secondary | ICD-10-CM | POA: Insufficient documentation

## 2018-11-08 DIAGNOSIS — R51 Headache: Secondary | ICD-10-CM | POA: Insufficient documentation

## 2018-11-08 LAB — URINALYSIS, COMPLETE (UACMP) WITH MICROSCOPIC
Bacteria, UA: NONE SEEN
Bilirubin Urine: NEGATIVE
Glucose, UA: NEGATIVE mg/dL
Hgb urine dipstick: NEGATIVE
Ketones, ur: NEGATIVE mg/dL
Leukocytes,Ua: NEGATIVE
Nitrite: NEGATIVE
Protein, ur: NEGATIVE mg/dL
Specific Gravity, Urine: 1.019 (ref 1.005–1.030)
pH: 5 (ref 5.0–8.0)

## 2018-11-08 LAB — HEPATIC FUNCTION PANEL
ALT: 23 U/L (ref 0–44)
AST: 19 U/L (ref 15–41)
Albumin: 3.8 g/dL (ref 3.5–5.0)
Alkaline Phosphatase: 62 U/L (ref 38–126)
Bilirubin, Direct: <0.1 mg/dL (ref 0.0–0.2)
Total Bilirubin: 0.7 mg/dL (ref 0.3–1.2)
Total Protein: 7.4 g/dL (ref 6.5–8.1)

## 2018-11-08 LAB — CBC
HCT: 40.9 % (ref 36.0–46.0)
Hemoglobin: 14.1 g/dL (ref 12.0–15.0)
MCH: 32.4 pg (ref 26.0–34.0)
MCHC: 34.5 g/dL (ref 30.0–36.0)
MCV: 94 fL (ref 80.0–100.0)
Platelets: 197 10*3/uL (ref 150–400)
RBC: 4.35 MIL/uL (ref 3.87–5.11)
RDW: 12.7 % (ref 11.5–15.5)
WBC: 5 10*3/uL (ref 4.0–10.5)
nRBC: 0 % (ref 0.0–0.2)

## 2018-11-08 LAB — BASIC METABOLIC PANEL
Anion gap: 8 (ref 5–15)
BUN: 13 mg/dL (ref 6–20)
CO2: 25 mmol/L (ref 22–32)
Calcium: 9 mg/dL (ref 8.9–10.3)
Chloride: 103 mmol/L (ref 98–111)
Creatinine, Ser: 0.71 mg/dL (ref 0.44–1.00)
GFR calc Af Amer: >60 mL/min (ref >60)
GFR calc non Af Amer: >60 mL/min (ref >60)
Glucose, Bld: 103 mg/dL — ABNORMAL HIGH (ref 70–99)
Potassium: 3.7 mmol/L (ref 3.5–5.1)
Sodium: 136 mmol/L (ref 135–145)

## 2018-11-08 LAB — TROPONIN I (HIGH SENSITIVITY): Troponin I (High Sensitivity): 7 ng/L (ref <18)

## 2018-11-08 LAB — POCT PREGNANCY, URINE: Preg Test, Ur: NEGATIVE

## 2018-11-08 MED ORDER — SODIUM CHLORIDE 0.9 % IV BOLUS
1000.0000 mL | Freq: Once | INTRAVENOUS | Status: AC
  Administered 2018-11-08: 16:00:00 1000 mL via INTRAVENOUS

## 2018-11-08 MED ORDER — SODIUM CHLORIDE 0.9% FLUSH: 3.0000 mL | Freq: Once | INTRAVENOUS | Status: DC

## 2018-11-08 NOTE — ED Provider Notes (Signed)
[facility] Emergency Department Provider Note   ____________________________________________   First MD Initiated Contact with Patient 11/08/18 1512     (approximate)  I have reviewed the triage vital signs and the nursing notes.   HISTORY  Chief Complaint Dizziness and Blurred Vision    HPI Adrienne [name] is a 48 y.o. female who reports that she developed a headache on Monday its bitemporal bad headache but not severe comes and goes along with blurry vision also coming and going usually the blurry vision last only for a few seconds and goes away and returns again.  She has been lightheaded as well.  Not spinning.  She is not running a fever.  She is not coughing.  She did have a little bit of diarrhea yesterday and today.  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  She is not been drinking as much as usual.  She has no other medical problems.         Past Medical History:  Diagnosis Date  . GERD (gastroesophageal reflux disease)     There are no active problems to display for this patient.   Past Surgical History:  Procedure Laterality Date  . ABLATION    . CESAREAN SECTION    . DILATION AND CURETTAGE OF UTERUS    . TUBAL LIGATION      Prior to Admission medications   Medication Sig Start Date End Date Taking? Authorizing Provider  cyclobenzaprine (FLEXERIL) 10 MG tablet Take 1 tablet (10 mg total) by mouth 3 (three) times daily as needed. 06/22/18   Joni ReiningSmith, Ronald K, PA-C  ibuprofen (ADVIL,MOTRIN) 600 MG tablet Take 1 tablet (600 mg total) by mouth every 8 (eight) hours as needed. 06/22/18   Joni ReiningSmith, Ronald K, PA-C  medroxyPROGESTERone (DEPO-PROVERA) 150 MG/ML injection Inject 150 mg into the muscle every 3 (three) months.    [provider]  traMADol (ULTRAM) 50 MG tablet Take 1 tablet (50 mg total) by mouth every 6 (six) hours as needed. 06/22/18 06/22/19  Joni ReiningSmith, Ronald K, PA-C    Allergies Patient has no known  allergies.  History reviewed. No pertinent family history.  Social History Social History   Tobacco Use  . Smoking status: Never Smoker  . Smokeless tobacco: Never Used  Substance Use Topics  . Alcohol use: Yes  . Drug use: No    Review of Systems  Constitutional: No fever/chills Eyes: See HPI ENT: No sore throat. Cardiovascular: Denies chest pain. Respiratory: Denies shortness of breath. Gastrointestinal: No abdominal pain.  No nausea, no vomiting.  No diarrhea.  No constipation. Genitourinary: Negative for dysuria. Musculoskeletal: Negative for back pain. Skin: Negative for rash. Neurological: Negative for  focal weakness   ____________________________________________   PHYSICAL EXAM:  VITAL SIGNS: ED Triage Vitals  Enc Vitals Group     BP 11/08/18 1211 131/86     Pulse Rate 11/08/18 1211 71     Resp 11/08/18 1211 16     Temp 11/08/18 1211 98.9 F (37.2 C)     Temp Source 11/08/18 1211 Oral     SpO2 11/08/18 1211 96 %     Weight 11/08/18 1212 215 lb (97.5 kg)     Height 11/08/18 1212 5\' 3"  (1.6 m)     Head Circumference --      Peak Flow --      Pain Score 11/08/18 1212 8     Pain Loc --  Pain Edu? --      Excl. in [place]? --     Constitutional: Alert and oriented. Well appearing and in no acute distress. Eyes: Conjunctivae are normal. PERRL. EOMI. fundi appear normal Head: Atraumatic. Nose: No congestion/rhinnorhea. Mouth/Throat: Mucous membranes are moist.  Oropharynx non-erythematous. Neck: No stridor. Cardiovascular: Normal rate, regular rhythm. Grossly normal heart sounds.  Good peripheral circulation. Respiratory: Normal respiratory effort.  No retractions. Lungs CTAB. Gastrointestinal: Soft and nontender. No distention. No abdominal bruits. No CVA tenderness. Musculoskeletal: No lower extremity tenderness nor edema.  Neurologic:  Normal speech and language. No gross focal neurologic deficits are appreciated.  Cranial nerves II through XII are  intact although visual fields are not checked cerebellar finger-nose heel-to-shin and rapid alternating movements and hands are normal motor strength is 5/5 throughout patient does not report any numbness Skin:  Skin is warm, dry and intact. No rash noted. Psychiatric: Mood and affect are normal. Speech and behavior are normal.  ____________________________________________   LABS (all labs ordered are listed, but only abnormal results are displayed)  Labs Reviewed  BASIC METABOLIC PANEL - Abnormal; Notable for the following components:      Result Value   Glucose, Bld 103 (*)    All other components within normal limits  URINALYSIS, COMPLETE (UACMP) WITH MICROSCOPIC - Abnormal; Notable for the following components:   Color, Urine YELLOW (*)    APPearance CLEAR (*)    All other components within normal limits  NOVEL CORONAVIRUS, NAA (HOSPITAL ORDER, SEND-OUT TO REF LAB)  CBC  HEPATIC FUNCTION PANEL  POCT PREGNANCY, URINE  POC URINE PREG, ED  TROPONIN I (HIGH SENSITIVITY)   ____________________________________________  EKG  EKG read and interpreted by me shows normal sinus rhythm rate of 67 normal axis no acute changes ____________________________________________  RADIOLOGY  ED MD interpretation:    Official radiology report(s): No results found.  ____________________________________________   PROCEDURES  Procedure(s) performed (including Critical Care):  Procedures   ____________________________________________   INITIAL IMPRESSION / ASSESSMENT AND PLAN / ED COURSE  After fluids the patient feels better.  Blurry vision is gone away.  I think the patient was just dehydrated.  I will let her go.  She will return if she has any further problems.            ____________________________________________   FINAL CLINICAL IMPRESSION(S) / ED DIAGNOSES  Final diagnoses:  Dizziness     ED Discharge Orders    None       Note:  This document was  prepared using Dragon voice recognition software and may include unintentional dictation errors.    Nena Polio, MD 11/08/18 [phone]

## 2018-11-08 NOTE — ED Triage Notes (Signed)
Patient presents to the ED with an episode today of dizziness and blurry vision that began around 10am.  Patient states since then her vision has been intermittently blurry.  Patient states she had a similar episode yesterday.  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  Patient states she drinks a lot of gatorade and water.

## 2018-11-08 NOTE — Discharge Instructions (Addendum)
I think it is likely you were dehydrated.  Please make sure you are drinking enough water.  The best thing to do is drink enough to keep your urine clear when you urinate.  Dark urine is a sign of dehydration.  Call tomorrow afternoon to check on the coronavirus test.  Let them know that Dr. Cinda Quest said it should be back tomorrow, if it is not as occasionally happens ,it should be back the next day.  Please quarantine yourself for the next day or 2 until you get the test back.  If it is negative you can go back to work, if it is positive you will have to continue quarantine yourself for 2 weeks.  Normal you can stop quarantining after 1 week of no symptoms but since you are not coughing or having achy joints are running a fever there really are not any other symptoms except the diarrhea to watch for.  Please return if you have any other problems or your symptoms return.

## 2018-11-09 LAB — NOVEL CORONAVIRUS, NAA (HOSP ORDER, SEND-OUT TO REF LAB; TAT 18-24 HRS): SARS-CoV-2, NAA: NOT DETECTED

## 2018-11-13 ENCOUNTER — Telehealth: Payer: Self-pay | Admitting: General Practice

## 2018-11-13 NOTE — Telephone Encounter (Signed)
Pt called in and gave her NEG Covid , expressed understanding

## 2019-05-18 ENCOUNTER — Other Ambulatory Visit: Payer: Self-pay

## 2019-05-18 ENCOUNTER — Encounter: Payer: Self-pay | Admitting: Emergency Medicine

## 2019-05-18 ENCOUNTER — Emergency Department: Payer: PRIVATE HEALTH INSURANCE

## 2019-05-18 ENCOUNTER — Emergency Department
Admission: EM | Admit: 2019-05-18 | Discharge: 2019-05-18 | Disposition: A | Discharge status: Home / Self Care | Payer: PRIVATE HEALTH INSURANCE | Attending: Emergency Medicine | Admitting: Emergency Medicine

## 2019-05-18 DIAGNOSIS — R102 Pelvic and perineal pain: Secondary | ICD-10-CM | POA: Insufficient documentation

## 2019-05-18 DIAGNOSIS — R1032 Left lower quadrant pain: Secondary | ICD-10-CM | POA: Diagnosis present

## 2019-05-18 DIAGNOSIS — R109 Unspecified abdominal pain: Secondary | ICD-10-CM

## 2019-05-18 LAB — URINALYSIS, COMPLETE (UACMP) WITH MICROSCOPIC
Bilirubin Urine: NEGATIVE
Glucose, UA: NEGATIVE mg/dL
Hgb urine dipstick: NEGATIVE
Ketones, ur: NEGATIVE mg/dL
Leukocytes,Ua: NEGATIVE
Nitrite: NEGATIVE
Protein, ur: NEGATIVE mg/dL
Specific Gravity, Urine: 1.023 (ref 1.005–1.030)
pH: 5 (ref 5.0–8.0)

## 2019-05-18 LAB — COMPREHENSIVE METABOLIC PANEL
ALT: 17 U/L (ref 0–44)
AST: 16 U/L (ref 15–41)
Albumin: 3.8 g/dL (ref 3.5–5.0)
Alkaline Phosphatase: 64 U/L (ref 38–126)
Anion gap: 8 (ref 5–15)
BUN: 15 mg/dL (ref 6–20)
CO2: 24 mmol/L (ref 22–32)
Calcium: 9.1 mg/dL (ref 8.9–10.3)
Chloride: 107 mmol/L (ref 98–111)
Creatinine, Ser: 0.87 mg/dL (ref 0.44–1.00)
GFR calc Af Amer: >60 mL/min (ref >60)
GFR calc non Af Amer: >60 mL/min (ref >60)
Glucose, Bld: 92 mg/dL (ref 70–99)
Potassium: 3.6 mmol/L (ref 3.5–5.1)
Sodium: 139 mmol/L (ref 135–145)
Total Bilirubin: 0.6 mg/dL (ref 0.3–1.2)
Total Protein: 7.9 g/dL (ref 6.5–8.1)

## 2019-05-18 LAB — LIPASE, BLOOD: Lipase: 24 U/L (ref 11–51)

## 2019-05-18 LAB — CBC
HCT: 38 % (ref 36.0–46.0)
Hemoglobin: 12.8 g/dL (ref 12.0–15.0)
MCH: 31.5 pg (ref 26.0–34.0)
MCHC: 33.7 g/dL (ref 30.0–36.0)
MCV: 93.6 fL (ref 80.0–100.0)
Platelets: 222 10*3/uL (ref 150–400)
RBC: 4.06 MIL/uL (ref 3.87–5.11)
RDW: 13.6 % (ref 11.5–15.5)
WBC: 6.3 10*3/uL (ref 4.0–10.5)
nRBC: 0 % (ref 0.0–0.2)

## 2019-05-18 LAB — PREGNANCY, URINE: Preg Test, Ur: NEGATIVE

## 2019-05-18 MED ORDER — MELOXICAM 15 MG PO TABS: 15.0000 mg | ORAL_TABLET | Freq: Every day | ORAL | 1 refills | Status: AC

## 2019-05-18 MED ORDER — SODIUM CHLORIDE 0.9% FLUSH: 3.0000 mL | Freq: Once | INTRAVENOUS | Status: DC

## 2019-05-18 MED ORDER — IOHEXOL 300 MG/ML  SOLN
100.0000 mL | Freq: Once | INTRAMUSCULAR | Status: AC | PRN
  Administered 2019-05-18: 100 mL via INTRAVENOUS
  Filled 2019-05-18: qty 100

## 2019-05-18 NOTE — ED Notes (Signed)
Pt back from US

## 2019-05-18 NOTE — ED Triage Notes (Signed)
FIRST NURSE NOTE- lower abdominal pain, NAD. Unlabored. ambulatory.

## 2019-05-18 NOTE — ED Notes (Signed)
Pt to US.

## 2019-05-18 NOTE — ED Provider Notes (Signed)
Emergency Department Provider Note  ____________________________________________  Time seen: Approximately 4:57 PM  I have reviewed the triage vital signs and the nursing notes.   HISTORY  Chief Complaint Abdominal Pain   Historian Patient     HPI Adrienne [name] is a 49 y.o. female presents to the emergency department with left lower quadrant abdominal pain for approximately 2 weeks.  Patient reports that when her pain started, she had some increased urinary frequency and some mild dysuria and was placed on Bactrim by her primary care provider for urinary tract infection.  Patient states that she has not had any low back pain, nausea, vomiting or fever.  She reports that her dysuria and increased urinary frequency has completely improved.  She reports that her left-sided pelvic pain is worsened when she stands.  She denies any bulge on the left lower abdomen and has no prior history of hernias.  She has had no diarrhea or similar symptoms in the past.  No falls or abdominal tenderness.  No other alleviating measures have been attempted.   Past Medical History:  Diagnosis Date  . GERD (gastroesophageal reflux disease)      Immunizations up to date:  Yes.     Past Medical History:  Diagnosis Date  . GERD (gastroesophageal reflux disease)     There are no problems to display for this patient.   Past Surgical History:  Procedure Laterality Date  . ABLATION    . CESAREAN SECTION    . DILATION AND CURETTAGE OF UTERUS    . TUBAL LIGATION      Prior to Admission medications   Medication Sig Start Date End Date Taking? Authorizing Provider  cyclobenzaprine (FLEXERIL) 10 MG tablet Take 1 tablet (10 mg total) by mouth 3 (three) times daily as needed. 06/22/18   Joni Reining, PA-C  ibuprofen (ADVIL,MOTRIN) 600 MG tablet Take 1 tablet (600 mg total) by mouth every 8 (eight) hours as needed. 06/22/18   Joni Reining, PA-C  medroxyPROGESTERone (DEPO-PROVERA) 150 MG/ML  injection Inject 150 mg into the muscle every 3 (three) months.    [provider]  traMADol (ULTRAM) 50 MG tablet Take 1 tablet (50 mg total) by mouth every 6 (six) hours as needed. 06/22/18 06/22/19  Joni Reining, PA-C    Allergies Patient has no known allergies.  No family history on file.  Social History Social History   Tobacco Use  . Smoking status: Never Smoker  . Smokeless tobacco: Never Used  Substance Use Topics  . Alcohol use: Yes  . Drug use: No     Review of Systems  Constitutional: No fever/chills Eyes:  No discharge ENT: No upper respiratory complaints. Respiratory: no cough. No SOB/ use of accessory muscles to breath Gastrointestinal:   No nausea, no vomiting.  No diarrhea.  No constipation. Genitourinary: Patient has left sided pelvic pain.  Musculoskeletal: Negative for musculoskeletal pain. Skin: Negative for rash, abrasions, lacerations, ecchymosis.    ____________________________________________   PHYSICAL EXAM:  VITAL SIGNS: ED Triage Vitals  Enc Vitals Group     BP 05/18/19 1543 (!) 144/94     Pulse Rate 05/18/19 1543 80     Resp 05/18/19 1543 16     Temp 05/18/19 1543 98.5 F (36.9 C)     Temp Source 05/18/19 1543 Oral     SpO2 05/18/19 1543 96 %     Weight 05/18/19 1541 220 lb (99.8 kg)     Height 05/18/19 1541 5\' 3"  (1.6  m)     Head Circumference --      Peak Flow --      Pain Score 05/18/19 1541 10     Pain Loc --      Pain Edu? --      Excl. in [place]? --      Constitutional: Alert and oriented. Well appearing and in no acute distress. Eyes: Conjunctivae are normal. PERRL. EOMI. Head: Atraumatic. Cardiovascular: Normal rate, regular rhythm. Normal S1 and S2.  Good peripheral circulation. Respiratory: Normal respiratory effort without tachypnea or retractions. Lungs CTAB. Good air entry to the bases with no decreased or absent breath sounds Gastrointestinal: Bowel sounds x 4 quadrants. Soft and nontender to palpation. No  guarding or rigidity. No distention. No CVA tenderness.  Musculoskeletal: Full range of motion to all extremities. No obvious deformities noted Neurologic:  Normal for age. No gross focal neurologic deficits are appreciated.  Skin:  Skin is warm, dry and intact. No rash noted. Psychiatric: Mood and affect are normal for age. Speech and behavior are normal.   ____________________________________________   LABS (all labs ordered are listed, but only abnormal results are displayed)  Labs Reviewed  URINALYSIS, COMPLETE (UACMP) WITH MICROSCOPIC - Abnormal; Notable for the following components:      Result Value   Color, Urine YELLOW (*)    APPearance HAZY (*)    Bacteria, UA RARE (*)    All other components within normal limits  LIPASE, BLOOD  COMPREHENSIVE METABOLIC PANEL  CBC  PREGNANCY, URINE   ____________________________________________  EKG   ____________________________________________  RADIOLOGY Geraldo Pitter, personally viewed and evaluated these images (plain radiographs) as part of my medical decision making, as well as reviewing the written report by the radiologist.  No results found.  ____________________________________________    PROCEDURES  Procedure(s) performed:     Procedures     Medications  sodium chloride flush (NS) 0.9 % injection 3 mL (3 mLs Intravenous Not Given 05/18/19 1557)     ____________________________________________   INITIAL IMPRESSION / ASSESSMENT AND PLAN / ED COURSE  Pertinent labs & imaging results that were available during my care of the patient were reviewed by me and considered in my medical decision making (see chart for details).  Clinical Course as of May 18 1655  Fri May 18, 2019  1647 Lipase, blood [JW]    Clinical Course User Index [JW] Orvil Feil, PA-C     Assessment and Plan: Pelvic Pain:  49 year old female presents to the emergency department with left-sided pelvic pain for the past 2  weeks worse with standing.  Patient was mildly hypertensive at triage but vital signs were otherwise reassuring.  On physical exam, patient had no guarding to palpation.  She did have some mild tenderness to palpation along the left lower quadrant.  Differential diagnosis includes cystitis, nephrolithiasis, ovarian cyst, ovarian torsion, pelvic mass, pelvic abscess, abdominal wall strain  Urinalysis was noncontributory for cystitis without blood, decreasing suspicion for nephrolithiasis.  Patient also had no low back pain or CVA tenderness.  CBC and CMP were reassuring.  Absence of leukocytosis, decreases suspicion for pelvic abscess.  Plan to obtain pelvic ultrasound to better assess for ovarian torsion, ovarian cyst or pelvic mass.  Will reassess  Pelvic ultrasound and CT abdomen and pelvis revealed no acute abnormality.  Patient was treated for abdominal wall strain with meloxicam.  She was advised to follow-up with primary care as needed.   ____________________________________________  FINAL CLINICAL IMPRESSION(S) / ED  DIAGNOSES  Final diagnoses:  Pelvic pain      NEW MEDICATIONS STARTED DURING THIS VISIT:  ED Discharge Orders    None          This chart was dictated using voice recognition software/Dragon. Despite best efforts to proofread, errors can occur which can change the meaning. Any change was purely unintentional.     Karren Cobble 05/18/19 2259    Duffy Bruce, MD 05/19/19 2107

## 2019-05-18 NOTE — ED Triage Notes (Signed)
Pt to ED via POV c/o LLQ abd pain x 2 weeks. Pt states that she saw her PCP and was diagnosed with UTI. Pt was given antibiotics. Pt states that she has finished her antibiotics, polyuria is better but pt is still having pain. Pt states that she is unable to stand for long periods of time due to the pain. Pt also states that when she walks the pain gets worse. Pt denies fever, chills, N/V. Pt is in NAD.

## 2019-07-17 ENCOUNTER — Encounter: Payer: Self-pay | Admitting: Emergency Medicine

## 2019-07-17 ENCOUNTER — Other Ambulatory Visit: Payer: Self-pay

## 2019-07-17 ENCOUNTER — Emergency Department
Admission: EM | Admit: 2019-07-17 | Discharge: 2019-07-17 | Disposition: A | Discharge status: Home / Self Care | Payer: PRIVATE HEALTH INSURANCE | Attending: Emergency Medicine | Admitting: Emergency Medicine

## 2019-07-17 DIAGNOSIS — R109 Unspecified abdominal pain: Secondary | ICD-10-CM | POA: Insufficient documentation

## 2019-07-17 LAB — URINALYSIS, COMPLETE (UACMP) WITH MICROSCOPIC
Bacteria, UA: NONE SEEN
Bilirubin Urine: NEGATIVE
Glucose, UA: NEGATIVE mg/dL
Hgb urine dipstick: NEGATIVE
Ketones, ur: NEGATIVE mg/dL
Leukocytes,Ua: NEGATIVE
Nitrite: NEGATIVE
Protein, ur: NEGATIVE mg/dL
Specific Gravity, Urine: 1.024 (ref 1.005–1.030)
pH: 5 (ref 5.0–8.0)

## 2019-07-17 MED ORDER — TRAMADOL HCL 50 MG PO TABS: 50.0000 mg | ORAL_TABLET | Freq: Four times a day (QID) | ORAL | 0 refills | Status: DC | PRN

## 2019-07-17 MED ORDER — IBUPROFEN 600 MG PO TABS: 600.0000 mg | ORAL_TABLET | Freq: Three times a day (TID) | ORAL | 0 refills | Status: DC | PRN

## 2019-07-17 MED ORDER — ORPHENADRINE CITRATE 30 MG/ML IJ SOLN
60.0000 mg | Freq: Two times a day (BID) | INTRAMUSCULAR | Status: DC
  Administered 2019-07-17: 60 mg via INTRAMUSCULAR
  Filled 2019-07-17: qty 2

## 2019-07-17 MED ORDER — HYDROMORPHONE HCL 1 MG/ML IJ SOLN
1.0000 mg | Freq: Once | INTRAMUSCULAR | Status: AC
  Administered 2019-07-17: 1 mg via INTRAMUSCULAR
  Filled 2019-07-17: qty 1

## 2019-07-17 MED ORDER — CYCLOBENZAPRINE HCL 10 MG PO TABS: 10.0000 mg | ORAL_TABLET | Freq: Three times a day (TID) | ORAL | 0 refills | Status: DC | PRN

## 2019-07-17 NOTE — ED Notes (Signed)
See triage note  Presents with  Upper back pain which is moving around to lateral rib area   States pain started on Sunday   Describes as muscle type pain

## 2019-07-17 NOTE — ED Triage Notes (Signed)
Pt reports back pain to her mid back since Sunday that is throbbing. Pt reports does some lifting at work but cannot recall an injury. Pt denies all other sx's.

## 2019-07-17 NOTE — ED Provider Notes (Signed)
[facility] Emergency Department Provider Note   ____________________________________________   First MD Initiated Contact with Patient 07/17/19 1045     (approximate)  I have reviewed the triage vital signs and the nursing notes.   HISTORY  Chief Complaint Back Pain    HPI Adrienne [name] is a 49 y.o. female patient presents with 3 days of increasing left Flank Pain.  Patient states she believes it is secondary to lifting/ pulling incident which occurred at work.  Patient denies urinary frequency urgency or dysuria.  Patient denies fever/vaginal discharge associated with complaint.  Mild relief with over-the-counter anti-inflammatory medications.  Patient rates pain as of 10/10.  Patient described the pain as "throbbing".         Past Medical History:  Diagnosis Date  . GERD (gastroesophageal reflux disease)     There are no problems to display for this patient.   Past Surgical History:  Procedure Laterality Date  . ABLATION    . CESAREAN SECTION    . DILATION AND CURETTAGE OF UTERUS    . TUBAL LIGATION      Prior to Admission medications   Medication Sig Start Date End Date Taking? Authorizing Provider  cyclobenzaprine (FLEXERIL) 10 MG tablet Take 1 tablet (10 mg total) by mouth 3 (three) times daily as needed. 07/17/19   Joni Reining, PA-C  ibuprofen (ADVIL) 600 MG tablet Take 1 tablet (600 mg total) by mouth every 8 (eight) hours as needed. 07/17/19   Joni Reining, PA-C  medroxyPROGESTERone (DEPO-PROVERA) 150 MG/ML injection Inject 150 mg into the muscle every 3 (three) months.    [provider]  traMADol (ULTRAM) 50 MG tablet Take 1 tablet (50 mg total) by mouth every 6 (six) hours as needed. 07/17/19 07/16/20  Joni Reining, PA-C    Allergies Patient has no known allergies.  No family history on file.  Social History Social History   Tobacco Use  . Smoking status: Never Smoker  . Smokeless tobacco: Never Used    Substance Use Topics  . Alcohol use: Yes  . Drug use: No    Review of Systems Constitutional: No fever/chills Eyes: No visual changes. ENT: No sore throat. Cardiovascular: Denies chest pain. Respiratory: Denies shortness of breath. Gastrointestinal: No abdominal pain.  No nausea, no vomiting.  No diarrhea.  No constipation. Genitourinary: Negative for dysuria. Musculoskeletal: Left flank pain. Skin: Negative for rash. Neurological: Negative for headaches, focal weakness or numbness.  ____________________________________________   PHYSICAL EXAM:  VITAL SIGNS: ED Triage Vitals [07/17/19 1034]  Enc Vitals Group     BP      Pulse      Resp      Temp      Temp src      SpO2      Weight 220 lb (99.8 kg)     Height 5\' 3"  (1.6 m)     Head Circumference      Peak Flow      Pain Score 10     Pain Loc      Pain Edu?      Excl. in [place]?    Constitutional: Alert and oriented. Well appearing and in no acute distress. Cardiovascular: Normal rate, regular rhythm. Grossly normal heart sounds.  Good peripheral circulation. Respiratory: Normal respiratory effort.  No retractions. Lungs CTAB. Gastrointestinal: Soft and nontender. No distention. No abdominal bruits.  Left CVA tenderness. Musculoskeletal: No lower extremity tenderness nor edema.  No joint effusions. Neurologic:  Normal speech and language. No gross focal neurologic deficits are appreciated. No gait instability. Skin:  Skin is warm, dry and intact. No rash noted. Psychiatric: Mood and affect are normal. Speech and behavior are normal.  ____________________________________________   LABS (all labs ordered are listed, but only abnormal results are displayed)  Labs Reviewed  URINALYSIS, COMPLETE (UACMP) WITH MICROSCOPIC - Abnormal; Notable for the following components:      Result Value   Color, Urine YELLOW (*)    APPearance HAZY (*)    All other components within normal limits    ____________________________________________  EKG   ____________________________________________  RADIOLOGY  ED MD interpretation:    Official radiology report(s): No results found.  ____________________________________________   PROCEDURES  Procedure(s) performed (including Critical Care):  Procedures   ____________________________________________   INITIAL IMPRESSION / ASSESSMENT AND PLAN / ED COURSE  As part of my medical decision making, I reviewed the following data within the [place]     Patient presents with left flank pain for 2 to 3 days.  Discussed negative urine results with patient.  Physical exam consistent with muscle strain.  Patient given discharge care instruction work note.  Patient advised to follow-up with PCP if condition persist.    Adrienne [name] was evaluated in Emergency Department on 07/17/2019 for the symptoms described in the history of present illness. She was evaluated in the context of the global COVID-19 pandemic, which necessitated consideration that the patient might be at risk for infection with the SARS-CoV-2 virus that causes COVID-19. Institutional protocols and algorithms that pertain to the evaluation of patients at risk for COVID-19 are in a state of rapid change based on information released by regulatory bodies including the CDC and federal and state organizations. These policies and algorithms were followed during the patient's care in the ED.       ____________________________________________   FINAL CLINICAL IMPRESSION(S) / ED DIAGNOSES  Final diagnoses:  Acute left flank pain     ED Discharge Orders         Ordered    traMADol (ULTRAM) 50 MG tablet  Every 6 hours PRN     07/17/19 1148    cyclobenzaprine (FLEXERIL) 10 MG tablet  3 times daily PRN     07/17/19 1148    ibuprofen (ADVIL) 600 MG tablet  Every 8 hours PRN     07/17/19 1148           Note:  This document was prepared using  Dragon voice recognition software and may include unintentional dictation errors.    Sable Feil, PA-C 07/17/19 1150    Carrie Mew, MD 07/17/19 1450

## 2019-07-17 NOTE — Discharge Instructions (Signed)
Follow discharge care instruction take medication as directed. °

## 2019-09-04 ENCOUNTER — Ambulatory Visit: Payer: Self-pay

## 2019-09-04 ENCOUNTER — Encounter: Payer: Self-pay | Admitting: Family Medicine

## 2019-09-04 ENCOUNTER — Other Ambulatory Visit: Payer: Self-pay

## 2019-09-04 ENCOUNTER — Ambulatory Visit (LOCAL_COMMUNITY_HEALTH_CENTER): Payer: Self-pay | Admitting: Family Medicine

## 2019-09-04 VITALS — BP 124/86 | Ht 62.5 in | Wt 217.4 lb

## 2019-09-04 DIAGNOSIS — Z3009 Encounter for other general counseling and advice on contraception: Secondary | ICD-10-CM

## 2019-09-04 MED ORDER — MEDROXYPROGESTERONE ACETATE 150 MG/ML IM SUSP
150.0000 mg | INTRAMUSCULAR | Status: AC
  Administered 2019-09-04 – 2020-04-23 (×4): 150 mg via INTRAMUSCULAR

## 2019-09-04 NOTE — Progress Notes (Signed)
Family Planning Visit  Subjective:  Adrienne [name] is a 49 y.o. being seen today for  Chief Complaint  Patient presents with  . Contraception    Pt does not have a problem list on file.  HPI  Patient reports she is here for Depo, declines formal annual exam visit. Last injection in 2019, pre-Covid. Has been using condoms occasionally.   Pt denies all of the following, which are contraindications to Depo use: Known breast cancer Pregnancy Also denies: Hypertension (CDC cat 2 if mild, cat 3 if severe) Severe cirrhosis, hepatocellular adenoma Diabetes with nephrosis or vascular complications Ischemic heart disease or multiple risk factors for atherosclerotic disease, and some forms of lupus Unexplained vaginal bleeding Pregnancy planned within the next year Long-term use of corticosteroid therapy in women with a history of, or risk factors for, nontraumatic (frailty) fractures.  Current use of aminoglutethimide (usually for the treatment of Cushing's syndrome) because aminoglutethimide may increase metabolism of progestins   Patient's last menstrual period was 08/24/2019 (exact date). Last sex: March BCM: condoms Pt desires EC? N/a  Last pap: 06/23/2016: NIL, HPV negative  Last breast exam: 06/2017. Agrees to this today.  Patient reports 1 partner(s) in last year. Do they desire STI screening (if no, why not)? No, declines  Does the patient desire a pregnancy in the next year? no   49 y.o., Body mass index is 39.13 kg/m. - Is patient eligible for HA1C diabetes screening based on BMI and age >35?  Yes, pt declines  Has patient been screened once for HCV in the past?  no  No results found for: HCVAB  Does the patient have current of drug use, have a partner with drug use, and/or has been incarcerated since last result? mp If yes-- Screen for HCV through [facility]   Does the patient meet criteria for HBV testing? mp  Criteria:  -Household, sexual or needle sharing  contact with HBV -History of drug use -HIV positive -Those with known Hep C  See flowsheet for other program required questions.   Health Maintenance Due  Topic Date Due  . HIV Screening  Never done  . COVID-19 Vaccine (1) Never done  . TETANUS/TDAP  Never done    ROS  See HPI  The following portions of the patient's history were reviewed and updated as appropriate: allergies, current medications, past family history, past medical history, past social history, past surgical history and problem list. Problem list updated.  Objective:   Vitals:   09/04/19 1553  BP: 124/86  Weight: 217 lb 6.4 oz (98.6 kg)  Height: 5' 2.5" (1.588 m)     Physical Exam  Vitals and nursing note reviewed.  Constitutional:      Appearance: Normal appearance.  HENT:     Head: Normocephalic and atraumatic.     Mouth/Throat:     Mouth: Mucous membranes are moist.     Pharynx: Oropharynx is clear. No oropharyngeal exudate or posterior oropharyngeal erythema.  Eyes:     Conjunctiva/sclera: Conjunctivae normal.  Neck:     Thyroid: No thyroid mass, thyromegaly or thyroid tenderness.  Cardiovascular:     Rate and Rhythm: Normal rate and regular rhythm.     Pulses: Normal pulses.     Heart sounds: Normal heart sounds.  Pulmonary:     Effort: Pulmonary effort is normal.     Breath sounds: Normal breath sounds.  Chest:     Breasts:        Right: Normal.  No swelling, mass, nipple discharge, skin change or tenderness.        Left: Normal. No swelling, mass, nipple discharge, skin change or tenderness.  Abdominal:     General: Abdomen is flat.     Palpations: There is no mass.     Tenderness: There is no abdominal tenderness. There is no rebound.  Lymphadenopathy:     Head:     Right side of head: No preauricular or posterior auricular adenopathy.     Left side of head: No preauricular or posterior auricular adenopathy.     Cervical: No cervical adenopathy.     Upper Body:     Right upper  body: No supraclavicular or axillary adenopathy.     Left upper body: No supraclavicular or axillary adenopathy.  Skin:    General: Skin is warm and dry.     Findings: No rash.  Neurological:     Mental Status: She is alert and oriented to person, place, and time.      Assessment and Plan:  Adrienne [name] is a 49 y.o. female presenting to the [facility] Department for a well woman exam/family planning visit  Contraception counseling: Reviewed all forms of birth control options in the tiered based approach. available including abstinence; over the counter/barrier methods; hormonal contraceptive medication including pill, patch, ring, injection,contraceptive implant, ECP; hormonal and nonhormonal IUDs; permanent sterilization options including vasectomy and the various tubal sterilization modalities. Risks, benefits, and typical effectiveness rates were reviewed.  Questions were answered.  Written information was also given to the patient to review.  Patient desires depo, this was prescribed for patient. She will follow up in  3 months for surveillance.  She was told to call with any further questions, or with any concerns about this method of contraception.  Emphasized use of condoms 100% of the time for STI prevention.  Emergency Contraception: n/a d/t period  1. Family planning services -BCM: Depo today x1 yr, counseling as above. -Pap: will be due in 2023 -CBE: done today. "Active FYIs" info up to date. Recommended screening mammograms beginning at age 66 -STI screen: pt declines -A1c: pt declines - medroxyPROGESTERone (DEPO-PROVERA) injection 150 mg     No follow-ups on file.  No future appointments.  Ann Held, PA-C

## 2019-09-04 NOTE — Progress Notes (Signed)
Here for Depo only. Declines PE and STD screening today. Last PE here was 07/15/2017, last Pap Smear was 06/23/2016, last Depo was 10/03/2017. Tawny Hopping, RN

## 2019-09-04 NOTE — Progress Notes (Signed)
Provider orders completed. 

## 2019-11-20 ENCOUNTER — Encounter: Payer: Self-pay | Admitting: Family Medicine

## 2019-11-20 ENCOUNTER — Other Ambulatory Visit: Payer: Self-pay

## 2019-11-20 ENCOUNTER — Ambulatory Visit (LOCAL_COMMUNITY_HEALTH_CENTER): Payer: PRIVATE HEALTH INSURANCE | Admitting: Family Medicine

## 2019-11-20 VITALS — BP 139/89 | Ht 62.0 in | Wt 222.0 lb

## 2019-11-20 DIAGNOSIS — Z3042 Encounter for surveillance of injectable contraceptive: Secondary | ICD-10-CM | POA: Diagnosis not present

## 2019-11-20 DIAGNOSIS — Z3009 Encounter for other general counseling and advice on contraception: Secondary | ICD-10-CM | POA: Diagnosis not present

## 2019-11-20 NOTE — Progress Notes (Signed)
Client in clinic today for a Depo only.  Had her well woman's exam 08/2019

## 2019-11-20 NOTE — Progress Notes (Signed)
Pt is 11.0 weeks post depo today. DMPA 150 mg IM administered per Samara Snide, PA order dated 09/04/19. Pt states she is only at [facility] for depo today, no problems, does not need to see provider today.

## 2019-11-24 ENCOUNTER — Emergency Department
Admission: EM | Admit: 2019-11-24 | Discharge: 2019-11-24 | Disposition: A | Discharge status: Home / Self Care | Payer: No Typology Code available for payment source | Attending: Emergency Medicine | Admitting: Emergency Medicine

## 2019-11-24 ENCOUNTER — Emergency Department: Payer: No Typology Code available for payment source

## 2019-11-24 ENCOUNTER — Encounter: Payer: Self-pay | Admitting: Emergency Medicine

## 2019-11-24 ENCOUNTER — Other Ambulatory Visit: Payer: Self-pay

## 2019-11-24 DIAGNOSIS — S022XXA Fracture of nasal bones, initial encounter for closed fracture: Secondary | ICD-10-CM

## 2019-11-24 DIAGNOSIS — W228XXA Striking against or struck by other objects, initial encounter: Secondary | ICD-10-CM | POA: Insufficient documentation

## 2019-11-24 DIAGNOSIS — Y929 Unspecified place or not applicable: Secondary | ICD-10-CM | POA: Diagnosis not present

## 2019-11-24 DIAGNOSIS — Y939 Activity, unspecified: Secondary | ICD-10-CM | POA: Diagnosis not present

## 2019-11-24 DIAGNOSIS — S0992XA Unspecified injury of nose, initial encounter: Secondary | ICD-10-CM | POA: Insufficient documentation

## 2019-11-24 DIAGNOSIS — R040 Epistaxis: Secondary | ICD-10-CM | POA: Insufficient documentation

## 2019-11-24 DIAGNOSIS — S0993XA Unspecified injury of face, initial encounter: Secondary | ICD-10-CM | POA: Diagnosis present

## 2019-11-24 DIAGNOSIS — Y999 Unspecified external cause status: Secondary | ICD-10-CM | POA: Insufficient documentation

## 2019-11-24 MED ORDER — TRAMADOL HCL 50 MG PO TABS
50.0000 mg | ORAL_TABLET | Freq: Once | ORAL | Status: AC
  Administered 2019-11-24: 50 mg via ORAL
  Filled 2019-11-24: qty 1

## 2019-11-24 MED ORDER — OXYCODONE-ACETAMINOPHEN 7.5-325 MG PO TABS: 1.0000 | ORAL_TABLET | Freq: Four times a day (QID) | ORAL | 0 refills | Status: AC | PRN

## 2019-11-24 MED ORDER — IBUPROFEN 600 MG PO TABS: 600.0000 mg | ORAL_TABLET | Freq: Three times a day (TID) | ORAL | 0 refills | Status: AC | PRN

## 2019-11-24 MED ORDER — NAPROXEN 500 MG PO TABS
500.0000 mg | ORAL_TABLET | Freq: Once | ORAL | Status: AC
  Administered 2019-11-24: 500 mg via ORAL
  Filled 2019-11-24: qty 1

## 2019-11-24 NOTE — ED Triage Notes (Signed)
Pt presents to ED via POV, states a rock came out of the lawn mower and hit her in the nose. Pt c/o swelling and bleeding at this time.

## 2019-11-24 NOTE — ED Notes (Signed)
See triage note  States she was standing outside and a rock flew up by lawnmower   Hitting her in the nose

## 2019-11-24 NOTE — ED Provider Notes (Signed)
[facility] Emergency Department Provider Note   ____________________________________________   First MD Initiated Contact with Patient 11/24/19 1438     (approximate)  I have reviewed the triage vital signs and the nursing notes.   HISTORY  Chief Complaint Facial Injury    HPI Adrienne [name] is a 49 y.o. female patient presents with nasal edema and pain secondary to blunt trauma.  Patient state a rock flew off of a lawnmower hitting her nose.  Patient denies LOC or further head injuries.  Patient denies vision disturbance or vertigo.  Patient bleeds controlled with direct pressure.  Rates pain as a 10/10 profuse pain medication at this time.  Described pain as "achy".         Past Medical History:  Diagnosis Date  . GERD (gastroesophageal reflux disease)     There are no problems to display for this patient.   Past Surgical History:  Procedure Laterality Date  . ABLATION    . CESAREAN SECTION    . DILATION AND CURETTAGE OF UTERUS    . TUBAL LIGATION      Prior to Admission medications   Medication Sig Start Date End Date Taking? Authorizing Provider  ibuprofen (ADVIL) 600 MG tablet Take 1 tablet (600 mg total) by mouth every 8 (eight) hours as needed. 11/24/19   Joni Reining, PA-C  oxyCODONE-acetaminophen (PERCOCET) 7.5-325 MG tablet Take 1 tablet by mouth every 6 (six) hours as needed. 11/24/19   Joni Reining, PA-C    Allergies Patient has no known allergies.  Family History  Problem Relation Age of Onset  . Hypertension Mother   . Heart disease Mother   . Hypertension Father   . Colon cancer Sister   . Hypertension Maternal Grandmother   . Heart disease Maternal Grandmother   . Diabetes Maternal Grandmother   . Heart disease Maternal Grandfather   . Hypertension Paternal Grandmother   . Breast cancer Neg Hx     Social History Social History   Tobacco Use  . Smoking status: Never Smoker  . Smokeless tobacco: Never  Used  Vaping Use  . Vaping Use: Never used  Substance Use Topics  . Alcohol use: Yes    Alcohol/week: 2.0 standard drinks    Types: 2 Shots of liquor per week  . Drug use: No    Review of Systems Constitutional: No fever/chills Eyes: No visual changes. ENT: No sore throat.  Nasal pain.  Mild epistaxis. Cardiovascular: Denies chest pain. Respiratory: Denies shortness of breath. Gastrointestinal: No abdominal pain.  No nausea, no vomiting.  No diarrhea.  No constipation. Genitourinary: Negative for dysuria. Musculoskeletal: Negative for back pain. Skin: Negative for rash. Neurological: Negative for headaches, focal weakness or numbness.   ____________________________________________   PHYSICAL EXAM:  VITAL SIGNS: ED Triage Vitals  Enc Vitals Group     BP 11/24/19 1405 (!) 129/84     Pulse Rate 11/24/19 1405 86     Resp --      Temp 11/24/19 1405 98.4 F (36.9 C)     Temp Source 11/24/19 1405 Oral     SpO2 11/24/19 1405 97 %     Weight 11/24/19 1401 (!) 222 lb (100.7 kg)     Height 11/24/19 1401 5\' 2"  (1.575 m)     Head Circumference --      Peak Flow --      Pain Score 11/24/19 1401 10     Pain Loc --  Pain Edu? --      Excl. in GC? --    Constitutional: Alert and oriented. Well appearing and in no acute distress. Eyes: Conjunctivae are normal. PERRL. EOMI. Head: Atraumatic. Nose: No congestion/rhinnorhea.  Dried blood right nare.  No obvious deformity. Mouth/Throat: Mucous membranes are moist.  Oropharynx non-erythematous. Neck: No stridor.   Cardiovascular: Normal rate, regular rhythm. Grossly normal heart sounds.  Good peripheral circulation. Respiratory: Normal respiratory effort.  No retractions. Lungs CTAB. Skin:  Skin is warm, dry and intact. No rash noted.  Nasal edema. Psychiatric: Mood and affect are normal. Speech and behavior are normal.  ____________________________________________   LABS (all labs ordered are listed, but only abnormal  results are displayed)  Labs Reviewed - No data to display ____________________________________________  EKG   ____________________________________________  RADIOLOGY  ED MD interpretation:    Official radiology report(s): DG Nasal Bones  Result Date: 11/24/2019 CLINICAL DATA:  Blunt trauma.  Nasal injury.  Swelling and bleeding. EXAM: NASAL BONES - 3+ VIEW COMPARISON:  None. FINDINGS: Slightly displaced fracture of the distal nasal bone. Surrounding osseous structures are unremarkable. Visualized paranasal sinuses appear clear. IMPRESSION: Slightly displaced fracture of the distal nasal bone. Electronically Signed   By: Bary Richard M.D.   On: 11/24/2019 15:19    ____________________________________________   PROCEDURES  Procedure(s) performed (including Critical Care):  Procedures   ____________________________________________   INITIAL IMPRESSION / ASSESSMENT AND PLAN / ED COURSE  As part of my medical decision making, I reviewed the following data within the electronic MEDICAL RECORD NUMBER     Patient presents with nasal edema and pain secondary to blunt trauma.  Discussed x-ray findings showing mildly displaced distal nasal bone fracture.  Patient given discharge care instruction advised take medication as directed.  Discussed sequela of injury.  Patient advised to follow ENT if no improvement or worsening complaint.    Adrienne [name] was evaluated in Emergency Department on 11/24/2019 for the symptoms described in the history of present illness. She was evaluated in the context of the global COVID-19 pandemic, which necessitated consideration that the patient might be at risk for infection with the SARS-CoV-2 virus that causes COVID-19. Institutional protocols and algorithms that pertain to the evaluation of patients at risk for COVID-19 are in a state of rapid change based on information released by regulatory bodies including the CDC and federal and state organizations.  These policies and algorithms were followed during the patient's care in the ED.       ____________________________________________   FINAL CLINICAL IMPRESSION(S) / ED DIAGNOSES  Final diagnoses:  None     ED Discharge Orders         Ordered    oxyCODONE-acetaminophen (PERCOCET) 7.5-325 MG tablet  Every 6 hours PRN     Discontinue  Reprint     11/24/19 1553    ibuprofen (ADVIL) 600 MG tablet  Every 8 hours PRN     Discontinue  Reprint     11/24/19 1553           Note:  This document was prepared using Dragon voice recognition software and may include unintentional dictation errors.    Joni Reining, PA-C 11/24/19 1559    Sharyn Creamer, MD 11/24/19 [phone]

## 2019-11-24 NOTE — Discharge Instructions (Signed)
Follow discharge care instruction take medication as directed.  Follow with ENT clinic if condition worsens.

## 2020-02-06 ENCOUNTER — Ambulatory Visit (LOCAL_COMMUNITY_HEALTH_CENTER): Payer: PRIVATE HEALTH INSURANCE

## 2020-02-06 ENCOUNTER — Other Ambulatory Visit: Payer: Self-pay

## 2020-02-06 VITALS — BP 136/88 | Ht 62.0 in | Wt 220.0 lb

## 2020-02-06 DIAGNOSIS — Z3009 Encounter for other general counseling and advice on contraception: Secondary | ICD-10-CM

## 2020-02-06 DIAGNOSIS — Z3042 Encounter for surveillance of injectable contraceptive: Secondary | ICD-10-CM

## 2020-02-06 DIAGNOSIS — Z30013 Encounter for initial prescription of injectable contraceptive: Secondary | ICD-10-CM | POA: Diagnosis not present

## 2020-02-06 NOTE — Progress Notes (Signed)
Pt is 11.1 weeks post depo today.  DMPA 150 mg IM administered today per Landry Dyke, PA order dated 09/04/19.

## 2020-02-19 ENCOUNTER — Other Ambulatory Visit: Payer: Self-pay

## 2020-02-19 ENCOUNTER — Emergency Department
Admission: EM | Admit: 2020-02-19 | Discharge: 2020-02-19 | Disposition: A | Discharge status: Home / Self Care | Payer: Self-pay | Attending: Emergency Medicine | Admitting: Emergency Medicine

## 2020-02-19 ENCOUNTER — Encounter: Payer: Self-pay | Admitting: Emergency Medicine

## 2020-02-19 ENCOUNTER — Emergency Department: Payer: Self-pay

## 2020-02-19 DIAGNOSIS — K219 Gastro-esophageal reflux disease without esophagitis: Secondary | ICD-10-CM | POA: Insufficient documentation

## 2020-02-19 DIAGNOSIS — K2970 Gastritis, unspecified, without bleeding: Secondary | ICD-10-CM

## 2020-02-19 DIAGNOSIS — R112 Nausea with vomiting, unspecified: Secondary | ICD-10-CM | POA: Insufficient documentation

## 2020-02-19 DIAGNOSIS — R197 Diarrhea, unspecified: Secondary | ICD-10-CM | POA: Insufficient documentation

## 2020-02-19 DIAGNOSIS — R1013 Epigastric pain: Secondary | ICD-10-CM | POA: Insufficient documentation

## 2020-02-19 LAB — COMPREHENSIVE METABOLIC PANEL
ALT: 20 U/L (ref 0–44)
AST: 19 U/L (ref 15–41)
Albumin: 3.9 g/dL (ref 3.5–5.0)
Alkaline Phosphatase: 55 U/L (ref 38–126)
Anion gap: 10 (ref 5–15)
BUN: 12 mg/dL (ref 6–20)
CO2: 22 mmol/L (ref 22–32)
Calcium: 9.1 mg/dL (ref 8.9–10.3)
Chloride: 105 mmol/L (ref 98–111)
Creatinine, Ser: 0.84 mg/dL (ref 0.44–1.00)
GFR, Estimated: >60 mL/min (ref >60)
Glucose, Bld: 116 mg/dL — ABNORMAL HIGH (ref 70–99)
Potassium: 3.5 mmol/L (ref 3.5–5.1)
Sodium: 137 mmol/L (ref 135–145)
Total Bilirubin: 0.7 mg/dL (ref 0.3–1.2)
Total Protein: 7.7 g/dL (ref 6.5–8.1)

## 2020-02-19 LAB — URINALYSIS, COMPLETE (UACMP) WITH MICROSCOPIC
Bacteria, UA: NONE SEEN
Bilirubin Urine: NEGATIVE
Glucose, UA: NEGATIVE mg/dL
Ketones, ur: NEGATIVE mg/dL
Leukocytes,Ua: NEGATIVE
Nitrite: NEGATIVE
Protein, ur: NEGATIVE mg/dL
Specific Gravity, Urine: 1.019 (ref 1.005–1.030)
pH: 5 (ref 5.0–8.0)

## 2020-02-19 LAB — CBC
HCT: 40 % (ref 36.0–46.0)
Hemoglobin: 13.9 g/dL (ref 12.0–15.0)
MCH: 32.3 pg (ref 26.0–34.0)
MCHC: 34.8 g/dL (ref 30.0–36.0)
MCV: 93 fL (ref 80.0–100.0)
Platelets: 191 10*3/uL (ref 150–400)
RBC: 4.3 MIL/uL (ref 3.87–5.11)
RDW: 13.6 % (ref 11.5–15.5)
WBC: 5.1 10*3/uL (ref 4.0–10.5)
nRBC: 0 % (ref 0.0–0.2)

## 2020-02-19 LAB — LIPASE, BLOOD: Lipase: 27 U/L (ref 11–51)

## 2020-02-19 MED ORDER — OMEPRAZOLE 40 MG PO CPDR: 40.0000 mg | DELAYED_RELEASE_CAPSULE | Freq: Every day | ORAL | 0 refills | Status: AC

## 2020-02-19 MED ORDER — SUCRALFATE 1 G PO TABS
1.0000 g | ORAL_TABLET | Freq: Once | ORAL | Status: AC
  Administered 2020-02-19: 1 g via ORAL
  Filled 2020-02-19: qty 1

## 2020-02-19 MED ORDER — SUCRALFATE 1 G PO TABS: 1.0000 g | ORAL_TABLET | Freq: Four times a day (QID) | ORAL | 0 refills | Status: AC | PRN

## 2020-02-19 NOTE — ED Notes (Signed)
EDP at bedside  

## 2020-02-19 NOTE — Discharge Instructions (Signed)
Take the Prilosec daily and you may take the Carafate on top of it as needed for upper abdominal pain.  You should follow-up with a gastroenterologist in the next few weeks if your symptoms do not improve.  Return to the ER for new, worsening, or persistent severe abdominal pain, vomiting, fever, weakness, or any other new or worsening symptoms that concern you.

## 2020-02-19 NOTE — ED Provider Notes (Signed)
[facility] Emergency Department Provider Note ____________________________________________   First MD Initiated Contact with Patient 02/19/20 1121     (approximate)  I have reviewed the triage vital signs and the nursing notes.   HISTORY  Chief Complaint Abdominal Pain    HPI Adrienne [name] is a 49 y.o. female with PMH as noted below including a history of GERD who presents with epigastric abdominal pain which has happened intermittently over the last few weeks, but has been more persistent over the last few days.  She states that it is midline and nonradiating.  It is worse after eating.  It is not relieved by omeprazole.  She reports a few episodes of vomiting, and has also had some loose stools.  She denies any fever or chills.  Past Medical History:  Diagnosis Date  . GERD (gastroesophageal reflux disease)     There are no problems to display for this patient.   Past Surgical History:  Procedure Laterality Date  . ABLATION    . CESAREAN SECTION    . DILATION AND CURETTAGE OF UTERUS    . TUBAL LIGATION      Prior to Admission medications   Medication Sig Start Date End Date Taking? Authorizing Provider  ibuprofen (ADVIL) 600 MG tablet Take 1 tablet (600 mg total) by mouth every 8 (eight) hours as needed. Patient not taking: Reported on 02/06/2020 11/24/19   Joni Reining, PA-C  oxyCODONE-acetaminophen (PERCOCET) 7.5-325 MG tablet Take 1 tablet by mouth every 6 (six) hours as needed. Patient not taking: Reported on 02/06/2020 11/24/19   Joni Reining, PA-C    Allergies Patient has no known allergies.  Family History  Problem Relation Age of Onset  . Hypertension Mother   . Heart disease Mother   . Hypertension Father   . Colon cancer Sister   . Hypertension Maternal Grandmother   . Heart disease Maternal Grandmother   . Diabetes Maternal Grandmother   . Heart disease Maternal Grandfather   . Hypertension Paternal Grandmother   .  Breast cancer Neg Hx     Social History Social History   Tobacco Use  . Smoking status: Never Smoker  . Smokeless tobacco: Never Used  Vaping Use  . Vaping Use: Never used  Substance Use Topics  . Alcohol use: Yes    Alcohol/week: 2.0 standard drinks    Types: 2 Shots of liquor per week  . Drug use: No    Review of Systems  Constitutional: No fever/chills. Eyes: No redness. ENT: No sore throat. Cardiovascular: Denies chest pain. Respiratory: Denies shortness of breath. Gastrointestinal: Positive for resolved vomiting. Genitourinary: Negative for dysuria.  Musculoskeletal: Negative for back pain. Skin: Negative for rash. Neurological: Negative for headache.   ____________________________________________   PHYSICAL EXAM:  VITAL SIGNS: ED Triage Vitals  Enc Vitals Group     BP 02/19/20 0903 (!) 154/91     Pulse Rate 02/19/20 0903 72     Resp 02/19/20 0903 17     Temp 02/19/20 0903 98.9 F (37.2 C)     Temp Source 02/19/20 0903 Oral     SpO2 02/19/20 0903 98 %     Weight 02/19/20 0911 220 lb 0.3 oz (99.8 kg)     Height 02/19/20 0911 5\' 2"  (1.575 m)     Head Circumference --      Peak Flow --      Pain Score 02/19/20 0911 10     Pain Loc --  Pain Edu? --      Excl. in GC? --     Constitutional: Alert and oriented. Well appearing and in no acute distress. Eyes: Conjunctivae are normal.  No scleral icterus. Head: Atraumatic. Nose: No congestion/rhinnorhea. Mouth/Throat: Mucous membranes are moist.   Neck: Normal range of motion.  Cardiovascular: Normal rate, regular rhythm. Good peripheral circulation. Respiratory: Normal respiratory effort.  No retractions. Gastrointestinal: Soft and nontender. No distention.  Genitourinary: No flank tenderness. Musculoskeletal: Extremities warm and well perfused.  Neurologic:  Normal speech and language. No gross focal neurologic deficits are appreciated.  Skin:  Skin is warm and dry. No rash noted. Psychiatric:  Mood and affect are normal. Speech and behavior are normal.  ____________________________________________   LABS (all labs ordered are listed, but only abnormal results are displayed)  Labs Reviewed  COMPREHENSIVE METABOLIC PANEL - Abnormal; Notable for the following components:      Result Value   Glucose, Bld 116 (*)    All other components within normal limits  URINALYSIS, COMPLETE (UACMP) WITH MICROSCOPIC - Abnormal; Notable for the following components:   Color, Urine YELLOW (*)    APPearance HAZY (*)    Hgb urine dipstick MODERATE (*)    All other components within normal limits  LIPASE, BLOOD  CBC   ____________________________________________  EKG  ED ECG REPORT I, Dionne Bucy, the attending physician, personally viewed and interpreted this ECG.  Date: 02/19/2020 EKG Time: 0906 Rate: 67 Rhythm: normal sinus rhythm QRS Axis: normal Intervals: normal ST/T Wave abnormalities: normal Narrative Interpretation: no evidence of acute ischemia ____________________________________________  RADIOLOGY  US abdomen RUQ: No acute abnormality  ____________________________________________   PROCEDURES  Procedure(s) performed: No  Procedures  Critical Care performed: No ____________________________________________   INITIAL IMPRESSION / ASSESSMENT AND PLAN / ED COURSE  Pertinent labs & imaging results that were available during my care of the patient were reviewed by me and considered in my medical decision making (see chart for details).  49 year old female with PMH as noted above presents with intermittent epigastric abdominal pain over the last few weeks.  She states that she has had similar symptoms intermittently in the past, however they have gotten worse over the last few days.  She also had some nausea and vomiting although this has not persisted.  She has been able to eat.  She has been taking omeprazole with no relief.  On exam, the patient is  overall well-appearing.  Her vital signs are normal except for mild hypertension.  The abdomen is soft with no focal tenderness.  Exam is otherwise unremarkable.  Overall presentation is most consistent with gastritis versus PUD.  Differential also includes acute gastroenteritis given the vomiting and loose stools in the last few days, or possible biliary colic.  I have a low suspicion for pancreatitis or acute cholecystitis.  We will obtain lab work-up, right upper quadrant ultrasound, give Carafate, and reassess.  ----------------------------------------- 1:58 PM on 02/19/2020 -----------------------------------------  Ultrasound shows no acute abnormalities.  The lab work-up is within normal limits.  The patient reports improved pain with Carafate.  At this time, she is stable for discharge.  I have prescribed Prilosec and Carafate, and provided GI referral.  Return precautions given, and the patient expresses understanding. ____________________________________________   FINAL CLINICAL IMPRESSION(S) / ED DIAGNOSES  Final diagnoses:  Epigastric abdominal pain      NEW MEDICATIONS STARTED DURING THIS VISIT:  New Prescriptions   No medications on file     Note:  This document  was prepared using Conservation officer, historic buildings and may include unintentional dictation errors.    Dionne Bucy, MD 02/19/20 1358

## 2020-02-19 NOTE — ED Triage Notes (Signed)
Patient to ER for c/o mid abd pain x2 days. Patient states she has worsening pain with eating. +Emesis x5-6 episodes. +Diarrhea x2 episodes today. Patient reports taking antacid with no relief. Reports intermittent pain to epigastric area/upper abd bilaterally.

## 2020-02-19 NOTE — ED Notes (Signed)
Pt ambulatory to treatment room with steady gait noted.  

## 2020-04-23 ENCOUNTER — Other Ambulatory Visit: Payer: Self-pay

## 2020-04-23 ENCOUNTER — Ambulatory Visit (LOCAL_COMMUNITY_HEALTH_CENTER): Payer: PRIVATE HEALTH INSURANCE

## 2020-04-23 VITALS — BP 135/87 | Ht 62.0 in | Wt 222.0 lb

## 2020-04-23 DIAGNOSIS — Z3042 Encounter for surveillance of injectable contraceptive: Secondary | ICD-10-CM | POA: Diagnosis not present

## 2020-04-23 DIAGNOSIS — Z3009 Encounter for other general counseling and advice on contraception: Secondary | ICD-10-CM | POA: Diagnosis not present

## 2020-04-23 NOTE — Progress Notes (Signed)
11 weeks post depo. Depo 150 mg IM given Left UOQ per order by Maximiano Coss, PA-C dated 09/04/2019. Tolerated well. Depo consent signed today. Next depo due 07/09/2020, pt aware. Jerel Shepherd, RN

## 2020-07-09 ENCOUNTER — Ambulatory Visit: Payer: Self-pay

## 2020-09-03 ENCOUNTER — Other Ambulatory Visit: Payer: Self-pay

## 2020-09-03 ENCOUNTER — Ambulatory Visit (LOCAL_COMMUNITY_HEALTH_CENTER): Payer: PRIVATE HEALTH INSURANCE | Admitting: Physician Assistant

## 2020-09-03 ENCOUNTER — Ambulatory Visit: Payer: Self-pay

## 2020-09-03 VITALS — BP 140/88 | Wt 228.0 lb

## 2020-09-03 DIAGNOSIS — Z3009 Encounter for other general counseling and advice on contraception: Secondary | ICD-10-CM | POA: Diagnosis not present

## 2020-09-03 DIAGNOSIS — Z3042 Encounter for surveillance of injectable contraceptive: Secondary | ICD-10-CM | POA: Diagnosis not present

## 2020-09-03 MED ORDER — MEDROXYPROGESTERONE ACETATE 150 MG/ML IM SUSP
150.0000 mg | Freq: Once | INTRAMUSCULAR | Status: AC
  Administered 2020-09-03: 150 mg via INTRAMUSCULAR

## 2020-09-03 NOTE — Progress Notes (Signed)
   [facility] problem visit  [facility]  Subjective:  Adrienne [name] is a 50 y.o. being seen today for Depo restart.  Chief Complaint  Patient presents with  . Contraception    Late Depo.    HPI Patient states that she is here for her Depo.  Per chart review, last Depo was 04/23/2020, so is 19 weeks since last Depo.  States that she has been having irregular bleeding off and on for about 1 month.  Per chart review, RP is due with next Depo.  Does the patient have a current or past history of drug use? No   No components found for: HCV]   Health Maintenance Due  Topic Date Due  . COVID-19 Vaccine (1) Never done  . HIV Screening  Never done  . Hepatitis C Screening  Never done  . TETANUS/TDAP  Never done  . COLONOSCOPY (Pts 45-21yrs Insurance coverage will need to be confirmed)  Never done    Review of Systems  All other systems reviewed and are negative.   The following portions of the patient's history were reviewed and updated as appropriate: allergies, current medications, past family history, past medical history, past social history, past surgical history and problem list. Problem list updated.   See flowsheet for other program required questions.  Objective:   Vitals:   09/03/20 1603  BP: 140/88  Weight: 228 lb (103.4 kg)    Physical Exam Constitutional:      General: She is not in acute distress.    Appearance: Normal appearance. She is obese.  HENT:     Head: Normocephalic and atraumatic.  Eyes:     Conjunctiva/sclera: Conjunctivae normal.  Pulmonary:     Effort: Pulmonary effort is normal.  Skin:    General: Skin is warm and dry.  Neurological:     Mental Status: She is alert and oriented to person, place, and time.  Psychiatric:        Mood and Affect: Mood normal.        Behavior: Behavior normal.        Thought Content: Thought content normal.        Judgment: Judgment normal.       Assessment and  Plan:  Adrienne [name] is a 50 y.o. female presenting to the [facility] for a Women's Health problem visit  1. Encounter for counseling regarding contraception Patient late for depo and states that last sex was over 1 month ago when she was still covered by the Depo. Enc condoms with all sex for 2 weeks after shot today. Counseled that patient will need RP when next Depo is due and that she should schedule for RP and get Depo in same visit. Counseled patient to follow up with PCP re: elevated BP.  2. Surveillance for Depo-Provera contraception Depo 150 mg IM given today in left hip per patient request. - medroxyPROGESTERone (DEPO-PROVERA) injection 150 mg     Return in about 3 months (around 12/04/2020) for RP and Depo.  No future appointments.  Matt Holmes, PA

## 2020-11-20 ENCOUNTER — Ambulatory Visit (LOCAL_COMMUNITY_HEALTH_CENTER): Payer: Self-pay

## 2020-11-20 ENCOUNTER — Other Ambulatory Visit: Payer: Self-pay

## 2020-11-20 ENCOUNTER — Telehealth: Payer: Self-pay

## 2020-11-20 VITALS — BP 147/99 | Ht 62.0 in | Wt 226.0 lb

## 2020-11-20 DIAGNOSIS — Z3042 Encounter for surveillance of injectable contraceptive: Secondary | ICD-10-CM | POA: Diagnosis not present

## 2020-11-20 DIAGNOSIS — Z3009 Encounter for other general counseling and advice on contraception: Secondary | ICD-10-CM | POA: Diagnosis not present

## 2020-11-20 MED ORDER — MEDROXYPROGESTERONE ACETATE 150 MG/ML IM SUSP
150.0000 mg | Freq: Once | INTRAMUSCULAR | Status: AC
  Administered 2020-11-20: 150 mg via INTRAMUSCULAR

## 2020-11-20 NOTE — Telephone Encounter (Signed)
Phone call to pt. Left message that RN with [facility] is calling about the depo only appt that was scheduled for today. After reviewing last visit notes, provider indicated must have physical at next visit, still good with depo covering for BC (11.1 weeks on 11/20/20), still have time to schedule physical and do depo at same visit. Please call us back at [phone] to speak to RN or [phone] to schedule physical.

## 2020-11-20 NOTE — Progress Notes (Signed)
Pt states she was told by clerical that this appt was for a physical. Pt gets "points" when she misses work. Pt not seeing anyone about BP, and when she goes to her other dr it is always fine. Denies headache, blurred vision and dizziness. Discussed pt situation about being scheduled for RN only depo visit vs physical and discussed BP readings with provider; per verbal order by Arnetha Courser, CNM, administered a one time depo today and provided pt with BP readings and instructed pt to share with her PCP. Physical scheduled for 12/11/20. Must have physical before next depo is administered. Provider orders completed.

## 2020-12-11 ENCOUNTER — Ambulatory Visit: Payer: Self-pay

## 2021-03-10 ENCOUNTER — Other Ambulatory Visit: Payer: Self-pay

## 2021-03-10 ENCOUNTER — Encounter: Payer: Self-pay | Admitting: Advanced Practice Midwife

## 2021-03-10 ENCOUNTER — Ambulatory Visit (LOCAL_COMMUNITY_HEALTH_CENTER): Payer: BC Managed Care – PPO | Admitting: Advanced Practice Midwife

## 2021-03-10 VITALS — BP 148/98 | HR 75 | Ht 62.5 in | Wt 230.6 lb

## 2021-03-10 DIAGNOSIS — Z30013 Encounter for initial prescription of injectable contraceptive: Secondary | ICD-10-CM | POA: Diagnosis not present

## 2021-03-10 DIAGNOSIS — R030 Elevated blood-pressure reading, without diagnosis of hypertension: Secondary | ICD-10-CM | POA: Insufficient documentation

## 2021-03-10 DIAGNOSIS — Z3009 Encounter for other general counseling and advice on contraception: Secondary | ICD-10-CM | POA: Diagnosis not present

## 2021-03-10 DIAGNOSIS — E669 Obesity, unspecified: Secondary | ICD-10-CM

## 2021-03-10 DIAGNOSIS — A599 Trichomoniasis, unspecified: Secondary | ICD-10-CM

## 2021-03-10 MED ORDER — METRONIDAZOLE 500 MG PO TABS: 500.0000 mg | ORAL_TABLET | Freq: Two times a day (BID) | ORAL | 0 refills | Status: AC

## 2021-03-10 MED ORDER — MEDROXYPROGESTERONE ACETATE 150 MG/ML IM SUSP
150.0000 mg | INTRAMUSCULAR | Status: AC
  Administered 2021-03-10 – 2021-11-19 (×4): 150 mg via INTRAMUSCULAR

## 2021-03-10 NOTE — Progress Notes (Signed)
Wet prep reviewed. Treated for trich per standing order. Client tolerated Depo without complaint. Jossie Ng, RN

## 2021-03-10 NOTE — Progress Notes (Signed)
[facility] DEPARTMENT [facility] [address] Main Number: [phone]    Family Planning Visit- Initial Visit  Subjective:  Adrienne [name] is a 50 y.o. SBF nonsmoker G7P2230 (30, 27, 21, 11)  being seen today for an initial annual visit and to discuss contraceptive options.  The patient is currently using Depo Provera for pregnancy prevention. Patient reports she does not want a pregnancy in the next year.  Patient has the following medical conditions has Elevated blood pressure reading 03/10/21=148/98 and Obesity BMI=41.5 on their problem list.  Chief Complaint  Patient presents with   Annual Exam    Patient reports here for physical and DMPA. Last DMPA 11/20/20. LMP 03/10/21. Last sex 08/2020 without condom; no longer with that partner. Last pap 06/23/16 neg HPV neg. Last dental exam 2021. Working 40 hrs/wk and living with her 4 kids and 1 grandbaby. Happy with DMPA. Last ETOH 03/07/21 (2 mixed drinks) 2x/mo. 148/98. On 11/20/20=147/99  Patient denies cigs, vaping, cigar, MJ  Body mass index is 41.51 kg/m. - Patient is eligible for diabetes screening based on BMI and age >17?  yes HA1C ordered? Pt declines  Patient reports 1  partner/s in last year. Desires STI screening?  No - declines bloodwork  Has patient been screened once for HCV in the past?  No  No results found for: HCVAB  Does the patient have current drug use (including MJ), have a partner with drug use, and/or has been incarcerated since last result? No  If yes-- Screen for HCV through [facility]   Does the patient meet criteria for HBV testing? No  Criteria:  -Household, sexual or needle sharing contact with HBV -History of drug use -HIV positive -Those with known Hep C   Health Maintenance Due  Topic Date Due   COVID-19 Vaccine (1) Never done   HIV Screening  Never done   Hepatitis C Screening  Never done   TETANUS/TDAP  Never done   COLONOSCOPY (Pts 45-85yrs  Insurance coverage will need to be confirmed)  Never done   INFLUENZA VACCINE  Never done    Review of Systems  Constitutional:  Positive for weight loss (loss and gain).  All other systems reviewed and are negative.  The following portions of the patient's history were reviewed and updated as appropriate: allergies, current medications, past family history, past medical history, past social history, past surgical history and problem list. Problem list updated.   See flowsheet for other program required questions.  Objective:   Vitals:   03/10/21 1541  BP: (!) 148/98  Pulse: 75  Weight: 230 lb 9.6 oz (104.6 kg)  Height: 5' 2.5" (1.588 m)    Physical Exam Constitutional:      Appearance: Normal appearance. She is obese.  HENT:     Head: Normocephalic and atraumatic.     Mouth/Throat:     Mouth: Mucous membranes are moist.     Comments: Last dental exam 2021 Eyes:     Conjunctiva/sclera: Conjunctivae normal.  Neck:     Thyroid: No thyroid mass, thyromegaly or thyroid tenderness.  Cardiovascular:     Rate and Rhythm: Normal rate and regular rhythm.  Pulmonary:     Effort: Pulmonary effort is normal.     Breath sounds: Normal breath sounds.  Chest:  Breasts:    Right: Normal.     Left: Normal.  Abdominal:     Palpations: Abdomen is soft.     Comments: Soft without  masses or tenderness, poor tone, increased adipose  Genitourinary:    General: Normal vulva.     Exam position: Lithotomy position.     Vagina: Vaginal discharge (light red menses, ph>4.5) present.     Cervix: Normal.     Uterus: Normal.      Rectum: Normal.     Comments: Difficult to assess uterus and adnexa due to increased adipose Musculoskeletal:        General: Normal range of motion.     Cervical back: Normal range of motion and neck supple.  Skin:    General: Skin is warm and dry.  Neurological:     Mental Status: She is alert.  Psychiatric:        Mood and Affect: Mood normal.       Assessment and Plan:  Adrienne [name] is a 50 y.o. female presenting to the [facility] for an initial annual wellness/contraceptive visit  Contraception counseling: Reviewed all forms of birth control options in the tiered based approach. available including abstinence; over the counter/barrier methods; hormonal contraceptive medication including pill, patch, ring, injection,contraceptive implant, ECP; hormonal and nonhormonal IUDs; permanent sterilization options including vasectomy and the various tubal sterilization modalities. Risks, benefits, and typical effectiveness rates were reviewed.  Questions were answered.  Written information was also given to the patient to review.  Patient desires continuation of DMPA, this was prescribed for patient. She will follow up in  11-13 wks for surveillance.  She was told to call with any further questions, or with any concerns about this method of contraception.  Emphasized use of condoms 100% of the time for STI prevention.  Patient was offered ECP. ECP was not accepted by the patient. ECP counseling was not given - see RN documentation  1. Elevated blood pressure reading 03/10/21=148/98 Referred to primary care MD Adrienne [name]  2. Obesity, unspecified classification, unspecified obesity type, unspecified whether serious comorbidity present   3. Family planning Treat wet mount per standing orders Please give dental list to pt  - WET PREP FOR TRICH, YEAST, CLUE - Chlamydia/Gonorrhea [facility] - medroxyPROGESTERone (DEPO-PROVERA) injection 150 mg  4. Encounter for initial prescription of injectable contraceptive May have DMPA 150 mg IM q 11-13 wks x 1 year Please counsel on need for abstinance next 7 days     No follow-ups on file.  No future appointments.  Adrienne [name], CNM

## 2021-03-11 LAB — WET PREP FOR TRICH, YEAST, CLUE
Trichomonas Exam: POSITIVE — AB
Yeast Exam: NEGATIVE

## 2021-04-26 DIAGNOSIS — I10 Essential (primary) hypertension: Secondary | ICD-10-CM

## 2021-04-26 HISTORY — DX: Essential (primary) hypertension: I10

## 2021-05-26 ENCOUNTER — Ambulatory Visit: Payer: BC Managed Care – PPO

## 2021-05-27 ENCOUNTER — Ambulatory Visit: Payer: BC Managed Care – PPO

## 2021-06-04 ENCOUNTER — Other Ambulatory Visit: Payer: Self-pay

## 2021-06-04 ENCOUNTER — Ambulatory Visit (LOCAL_COMMUNITY_HEALTH_CENTER): Payer: BC Managed Care – PPO

## 2021-06-04 VITALS — BP 153/99 | Ht 62.5 in | Wt 228.3 lb

## 2021-06-04 DIAGNOSIS — Z3009 Encounter for other general counseling and advice on contraception: Secondary | ICD-10-CM

## 2021-06-04 DIAGNOSIS — Z30013 Encounter for initial prescription of injectable contraceptive: Secondary | ICD-10-CM

## 2021-06-04 NOTE — Progress Notes (Signed)
Consult with Adrienne Rising FNP-BC regarding elevated BP. Per her verbal order: 1) may administer Depo 150 mg IM as ordered at physical appt 02/2021 and 2) counsel client on importance of BP follow-up with PCP and provide Adult Health Services info sheet (done). Client tolerated Depo without complaint. Jossie Ng, RN

## 2021-06-08 NOTE — Progress Notes (Signed)
Consulted by RN re: patient situation.  Reviewed RN note and agree that it reflects our discussion and my recommendations.  ° ° °Kerri Asche, FNP  °

## 2021-08-24 ENCOUNTER — Ambulatory Visit (LOCAL_COMMUNITY_HEALTH_CENTER): Payer: BC Managed Care – PPO

## 2021-08-24 VITALS — BP 157/99 | Ht 62.5 in | Wt 231.4 lb

## 2021-08-24 DIAGNOSIS — Z30013 Encounter for initial prescription of injectable contraceptive: Secondary | ICD-10-CM | POA: Diagnosis not present

## 2021-08-24 DIAGNOSIS — Z3009 Encounter for other general counseling and advice on contraception: Secondary | ICD-10-CM | POA: Diagnosis not present

## 2021-08-24 NOTE — Progress Notes (Signed)
11 weeks 4 days post depo.  Denies any problems with method.  States spotting about 5 days prior to injection.   ? ?Bp 157/99 in left arm; 156/99 in right arm.  Pt advised last visit to followup with PCP re: Bp. ?States her BP is only high when she comes here.  Consulted Glenna Fellows, NP, and she said continue with depo and pt needs to see PCP.  Bp readings from today's visit and 06/04/21 visit recorded and given to pt to take to PCP.   ?Says her PCP is [facility] and she will go today and get them to check BP.  Stressed importance of followup.   ? ?Depo given in RUOQ  per order from E. Sciora, CNM, dated 03/10/21.  Tolerated well. ?Cherlynn Polo, RN  ?

## 2021-11-19 ENCOUNTER — Ambulatory Visit (LOCAL_COMMUNITY_HEALTH_CENTER): Payer: BC Managed Care – PPO

## 2021-11-19 ENCOUNTER — Ambulatory Visit: Payer: BC Managed Care – PPO

## 2021-11-19 VITALS — BP 160/101 | Ht 62.5 in | Wt 235.5 lb

## 2021-11-19 DIAGNOSIS — Z30013 Encounter for initial prescription of injectable contraceptive: Secondary | ICD-10-CM

## 2021-11-19 DIAGNOSIS — Z3009 Encounter for other general counseling and advice on contraception: Secondary | ICD-10-CM | POA: Diagnosis not present

## 2021-11-19 NOTE — Progress Notes (Addendum)
12 weeks 3 days post depo.  Denies any complaints.  BP 160/101; rechecked 170/96. Bp last visit 157/99 and pt was instructed to see PCP [facility].  Says she went and they did not put her on medication.  Denies any s/s elevated BP.  Consulted E. Sciora CNM.  Pt instructed to followup with [facility].  Bp readings recorded and sent with pt to [facility] and requested to inform us of their recommendation for BP and continued depo administration.  Pt says she will go to [facility] today.  OK to give depo today.  Depo given IM LUOQ per order by E. Sciora CNM dated 03/10/21.  Tolerated well.  Cherlynn Polo, RN  Consulted on the plan of care for this client.  I agree with the documented note and actions taken to provide care for this client.  Hazle Coca, CNM

## 2021-11-23 NOTE — Progress Notes (Signed)
Fax received from [facility] stating that pt has been started on blood pressure medication, amlodipine with plans for close f-u. Fax sent for scanning. Jerel Shepherd, RN

## 2022-02-15 ENCOUNTER — Ambulatory Visit (LOCAL_COMMUNITY_HEALTH_CENTER): Payer: BC Managed Care – PPO

## 2022-02-15 VITALS — BP 158/98 | Ht 62.5 in | Wt 229.0 lb

## 2022-02-15 DIAGNOSIS — Z30013 Encounter for initial prescription of injectable contraceptive: Secondary | ICD-10-CM | POA: Diagnosis not present

## 2022-02-15 DIAGNOSIS — Z3009 Encounter for other general counseling and advice on contraception: Secondary | ICD-10-CM | POA: Diagnosis not present

## 2022-02-15 MED ORDER — MEDROXYPROGESTERONE ACETATE 150 MG/ML IM SUSP
150.0000 mg | Freq: Once | INTRAMUSCULAR | Status: AC
  Administered 2022-02-15: 150 mg via INTRAMUSCULAR

## 2022-02-15 NOTE — Progress Notes (Addendum)
12 weeks 4 days post depo. BP elevated today at 158/98. Reports mild headache. Pt states she ran out of HBP med (amlodipine) 4 days ago and is planning to pick up refill at [facility] today.  States she was unable to pick up BP med before now d/t irregular pharmacy hours.   Consult E Sciora, CNM who gives ok for depo today.  Depo given without problem RUOQ.  Annual PE due 03/11/2022 and pt plans to have annual when next depo is due, approx 05/03/2022. Josie Saunders, RN  Consulted on the plan of care for this client.  I agree with the documented note and actions taken to provide care for this client.  Ola Spurr, CNM

## 2022-03-15 IMAGING — CR DG NASAL BONES 3+V
3 series · 3 of 3 positions shown · non-contrast
Comparison: None.

CLINICAL DATA: Blunt trauma.  Nasal injury.  Swelling and bleeding.

EXAM:
NASAL BONES - 3+ VIEW

[nasal waters]
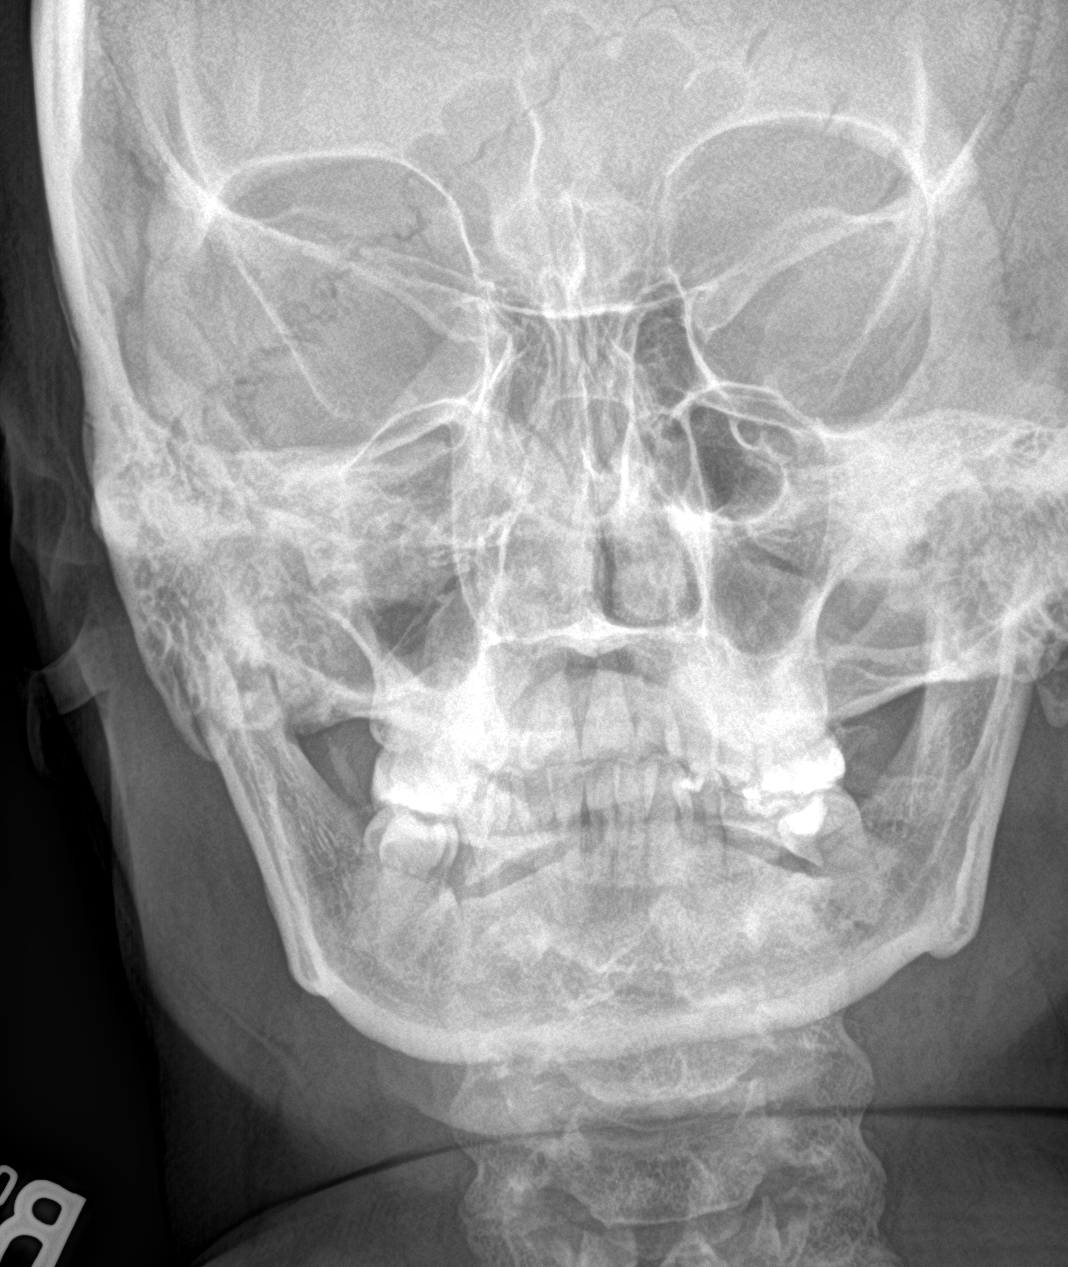

[nasal lat (1 of 2)]
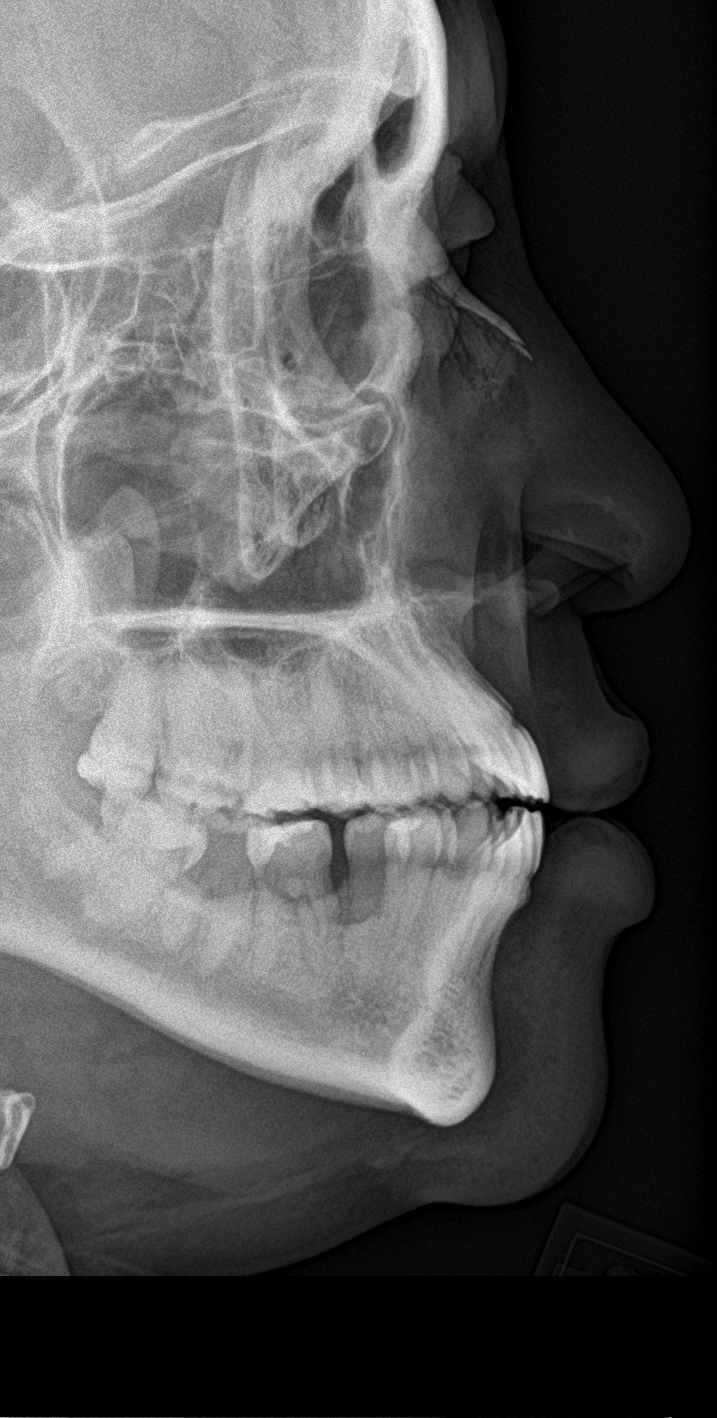

[nasal lat (2 of 2)]
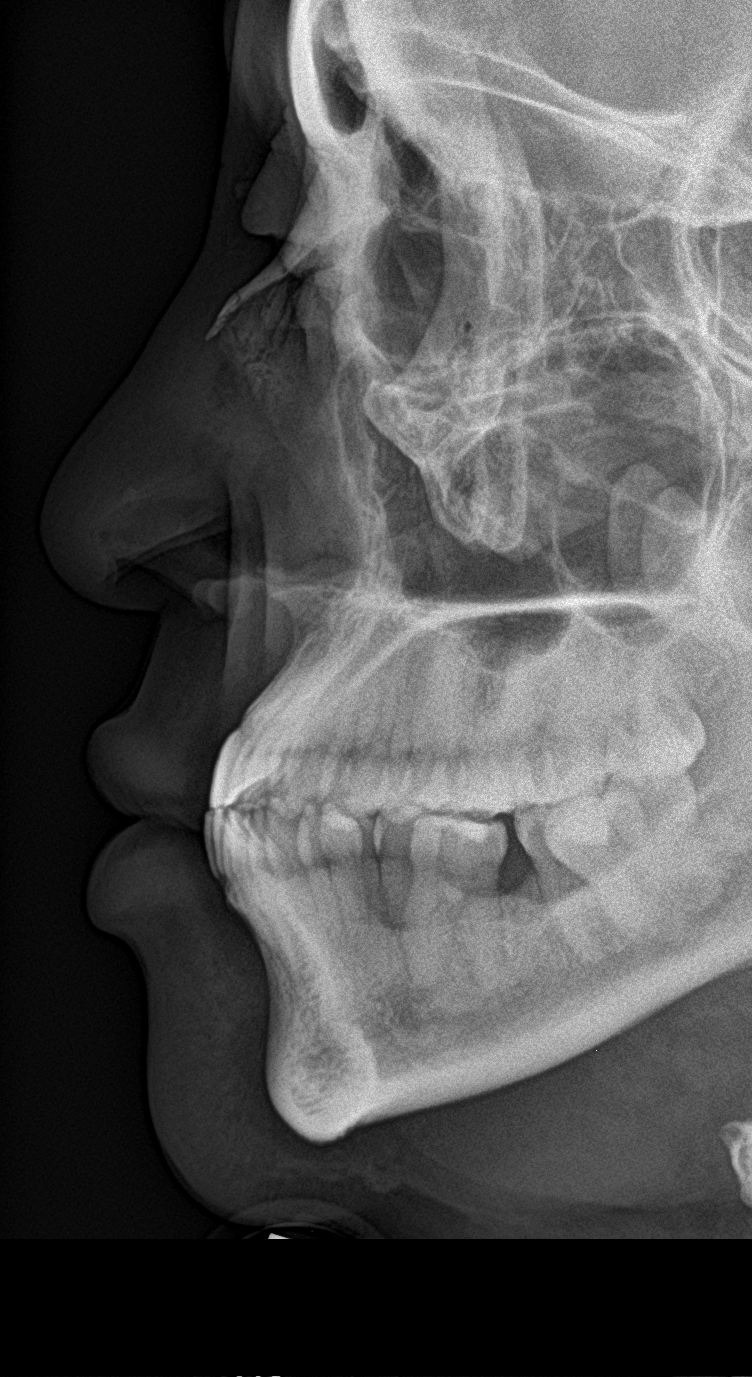

[3 of 3 positions shown; findings below may reference images not displayed]

FINDINGS: Slightly displaced fracture of the distal nasal bone. Surrounding
osseous structures are unremarkable. Visualized paranasal sinuses
appear clear.
IMPRESSION: Slightly displaced fracture of the distal nasal bone.

## 2022-05-07 ENCOUNTER — Ambulatory Visit (LOCAL_COMMUNITY_HEALTH_CENTER): Payer: BC Managed Care – PPO | Admitting: Family

## 2022-05-07 VITALS — BP 139/93 | HR 82 | Ht 63.0 in | Wt 231.0 lb

## 2022-05-07 DIAGNOSIS — Z308 Encounter for other contraceptive management: Secondary | ICD-10-CM | POA: Diagnosis not present

## 2022-05-07 DIAGNOSIS — Z01419 Encounter for gynecological examination (general) (routine) without abnormal findings: Secondary | ICD-10-CM

## 2022-05-07 DIAGNOSIS — Z30013 Encounter for initial prescription of injectable contraceptive: Secondary | ICD-10-CM

## 2022-05-07 DIAGNOSIS — Z3042 Encounter for surveillance of injectable contraceptive: Secondary | ICD-10-CM

## 2022-05-07 MED ORDER — MEDROXYPROGESTERONE ACETATE 150 MG/ML IM SUSP
150.0000 mg | INTRAMUSCULAR | Status: DC
  Administered 2022-05-07 – 2022-07-28 (×2): 150 mg via INTRAMUSCULAR

## 2022-05-07 NOTE — Progress Notes (Signed)
Pt is here for PE, Pap smear and Depo.  Pt declined STD testing.  Depo 150 mg given IM in LUOQ without any complications.  Pt given reminder card to return in 11-13 weeks for next Depo injection.  Windle Guard, RN

## 2022-05-13 LAB — GYN REPORT: PAP Smear Comment: 0

## 2022-05-18 ENCOUNTER — Telehealth: Payer: Self-pay

## 2022-05-18 NOTE — Telephone Encounter (Signed)
RN called pt and notified her that her HPV lab was unable to be run due to a lab error and asked if she wanted to schedule to come back in to have it redone at this time. Pt declined to schedule it at this time, and said she would schedule it with her next appointment.

## 2022-05-23 NOTE — Progress Notes (Signed)
[facility] Main Number: [phone]  Family Planning Visit- Repeat Yearly Visit  Subjective:  Adrienne [name] is a 52 y.o. [id number]  being seen today for an annual wellness visit and to discuss contraception options.   The patient is currently using Hormonal Injection for pregnancy prevention. Patient does not want a pregnancy in the next year.    report they are looking for a method that provides Minimal bleeding/improved bleeding profile   Patient has the following medical problems: has Elevated blood pressure reading 03/10/21=148/98 and Obesity BMI=41.5 on their problem list.  Chief Complaint  Patient presents with   Contraception    PE and Depo    Patient reports wants to continue Depo, on time.   See flowsheet for other program required questions.   Body mass index is 40.92 kg/m. - Patient is eligible for diabetes screening based on BMI> 25 and age >35?  yes HA1C ordered? No, does this with PCP.  Patient reports 1 partners in last year. Desires STI screening?    Has patient been screened once for HCV in the past?  No  No results found for: "HCVAB"  Does the patient have current of drug use, have a partner with drug use, and/or has been incarcerated since last result? No  If yes-- Screen for HCV through [facility]   Does the patient meet criteria for HBV testing? No  Criteria:  -Household, sexual or needle sharing contact with HBV -History of drug use -HIV positive -Those with known Hep C   Health Maintenance Due  Topic Date Due   COVID-19 Vaccine (1) Never done   HIV Screening  Never done   Hepatitis C Screening  Never done   DTaP/Tdap/Td (1 - Tdap) Never done   COLONOSCOPY (Pts 45-72yrs Insurance coverage will need to be confirmed)  Never done   INFLUENZA VACCINE  Never done    Review of Systems  All other systems reviewed and are negative.   The following portions of the  patient's history were reviewed and updated as appropriate: allergies, current medications, past family history, past medical history, past social history, past surgical history and problem list. Problem list updated.  Objective:   Vitals:   05/07/22 1526  BP: (!) 139/93  Pulse: 82  Weight: 231 lb (104.8 kg)  Height: 5\' 3"  (1.6 m)    Physical Exam Vitals and nursing note reviewed.  Constitutional:      Appearance: Normal appearance.  Cardiovascular:     Rate and Rhythm: Normal rate and regular rhythm.     Pulses: Normal pulses.     Heart sounds: Normal heart sounds.  Pulmonary:     Effort: Pulmonary effort is normal.     Breath sounds: Normal breath sounds.  Genitourinary:    General: Normal vulva.     Exam position: Lithotomy position.     Pubic Area: No rash.      Labia:        Right: No rash, tenderness or lesion.        Left: No rash, tenderness or lesion.      Urethra: No prolapse.     Vagina: Normal.     Cervix: Normal.     Uterus: Normal.      Adnexa: Right adnexa normal and left adnexa normal.     Comments: Genital exam deferred- no concerns today Lymphadenopathy:     Lower Body: No right inguinal adenopathy. No left  inguinal adenopathy.  Skin:    General: Skin is warm and dry.  Neurological:     Mental Status: She is alert and oriented to person, place, and time.       Assessment and Plan:  Adrienne [name] is a 52 y.o. female G7P2230 presenting to the [facility] for an yearly wellness and contraception visit   Contraception counseling: Reviewed options based on patient desire and reproductive life plan. Patient is interested in Hormonal Injection. This was provided to the patient today.   Risks, benefits, and typical effectiveness rates were reviewed.  Questions were answered.  Written information was also given to the patient to review.    The patient will follow up in  3 months for surveillance.  The patient was told to call with  any further questions, or with any concerns about this method of contraception.  Emphasized use of condoms 100% of the time for STI prevention.  Patient was assessed for need for ECP, not applicable  1. Well woman exam Brief PE with pelvic exam performed Pap collected today, last PAP 06/23/2016- NILM/HPV neg Continue care with PCP for hypertension and obesity  - IGP, Aptima HPV - medroxyPROGESTERone (DEPO-PROVERA) injection 150 mg  2. Encounter for surveillance of injectable contraceptive Depo Provera 150mg  q11-13 weeks with 4 refills  Give shot today  Return in about 3 months (around 08/06/2022) for Depo injection.  No future appointments.  Marline Backbone, FNP

## 2022-06-10 IMAGING — US US ABDOMEN LIMITED
1 series · 14 of 25 positions shown · non-contrast
Comparison: 05/18/2019, 10/22/2015

CLINICAL DATA: Epigastric pain

EXAM:
ULTRASOUND ABDOMEN LIMITED RIGHT UPPER QUADRANT

[Series 1: us abdomen limited ruq (liver/gb) · 14 of 66 slices shown]
[im 1/66]
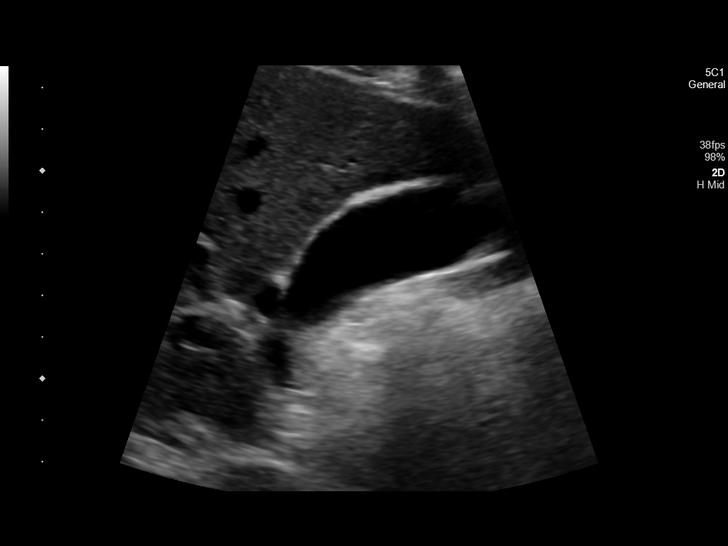
[im 6/66]
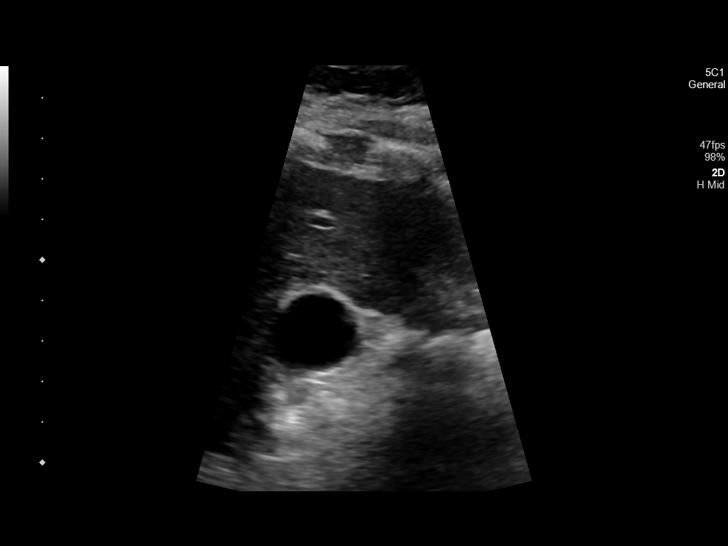
[im 11/66]
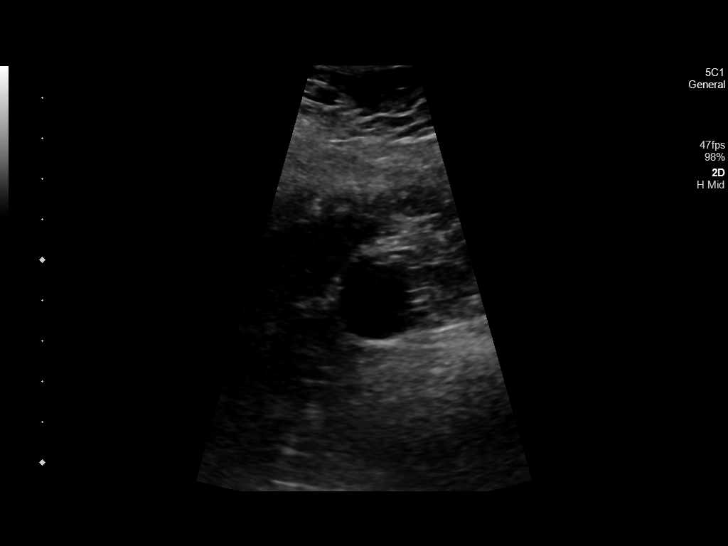
[im 17/66]
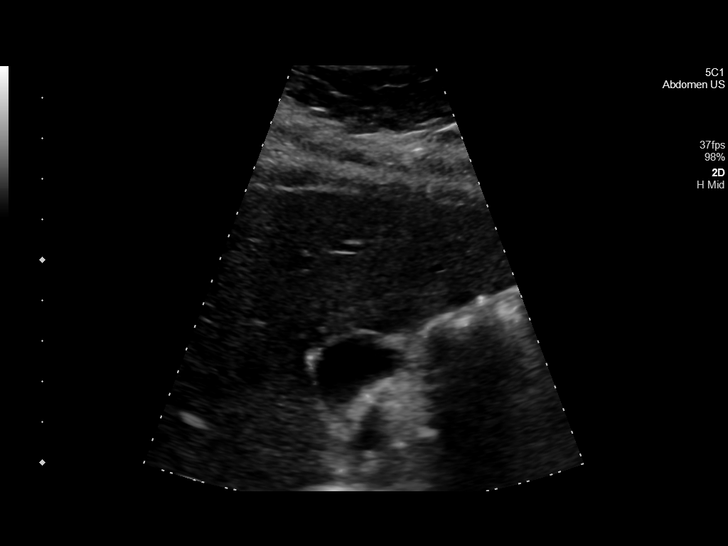
[im 22/66]
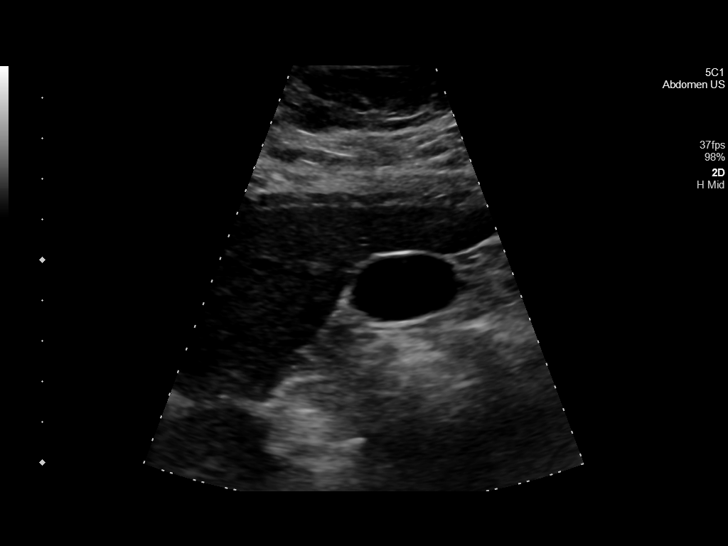
[im 25/66]
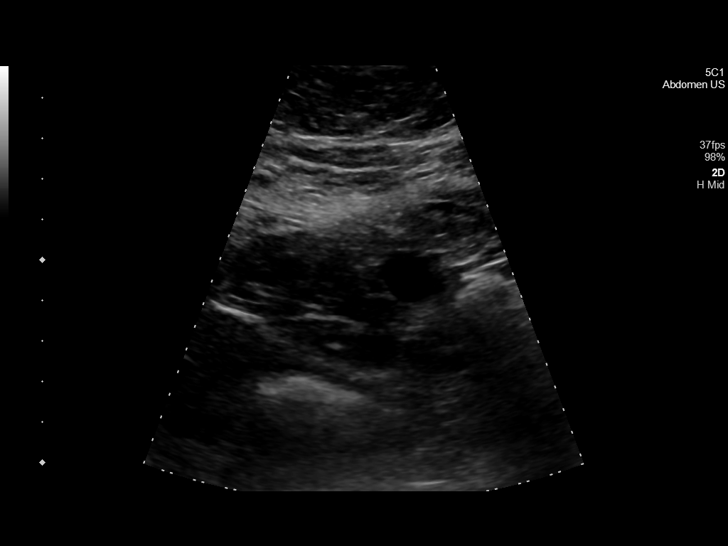
[im 30/66]
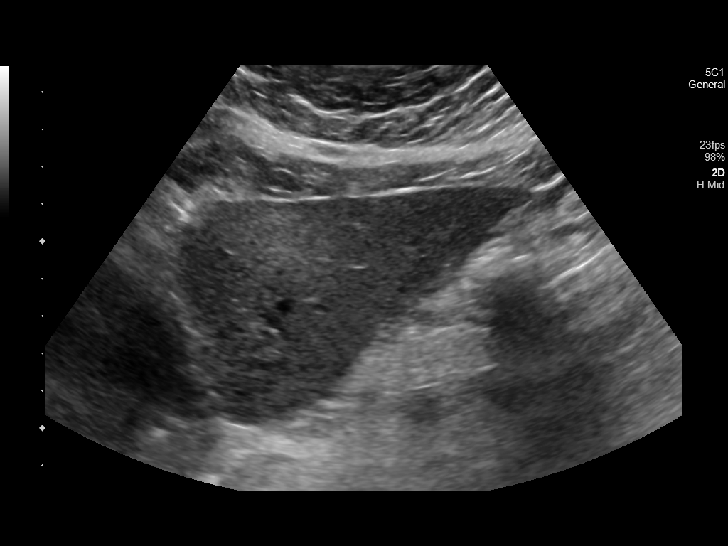
[im 36/66]
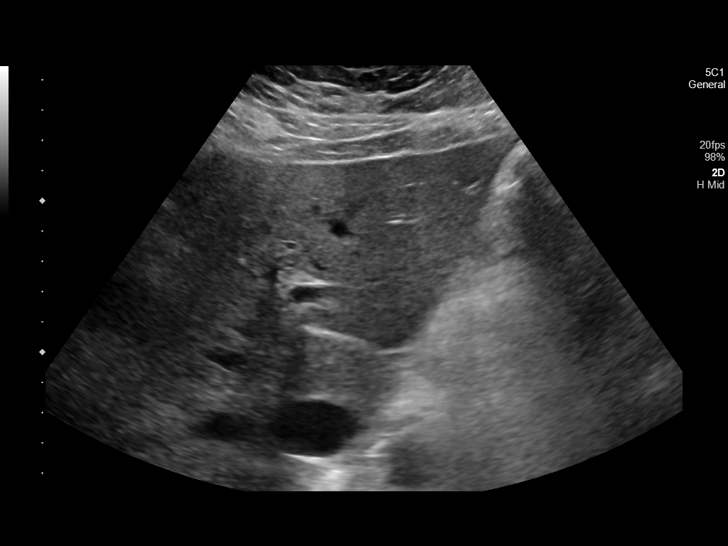
[im 41/66]
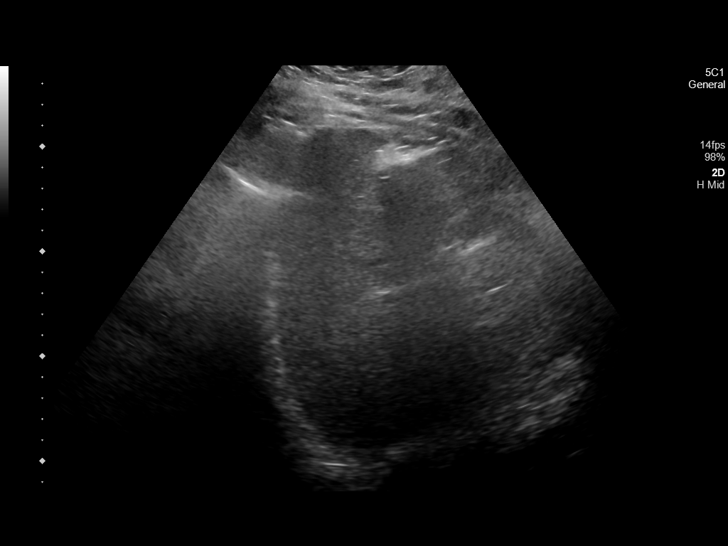
[im 44/66]
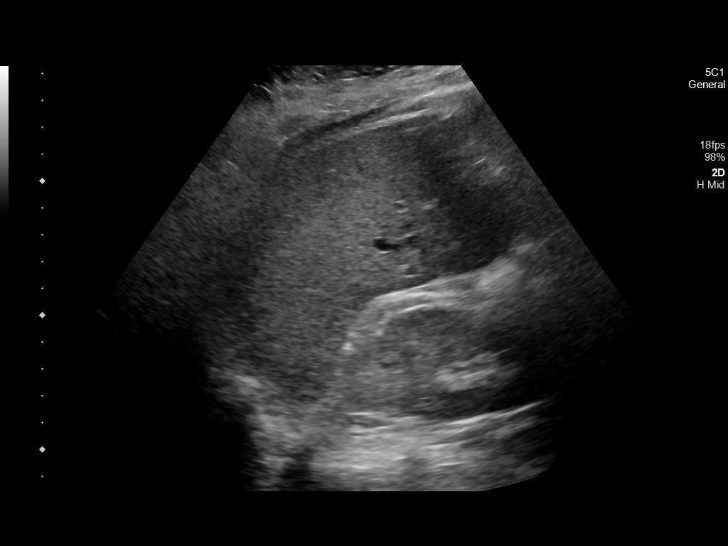
[im 49/66]
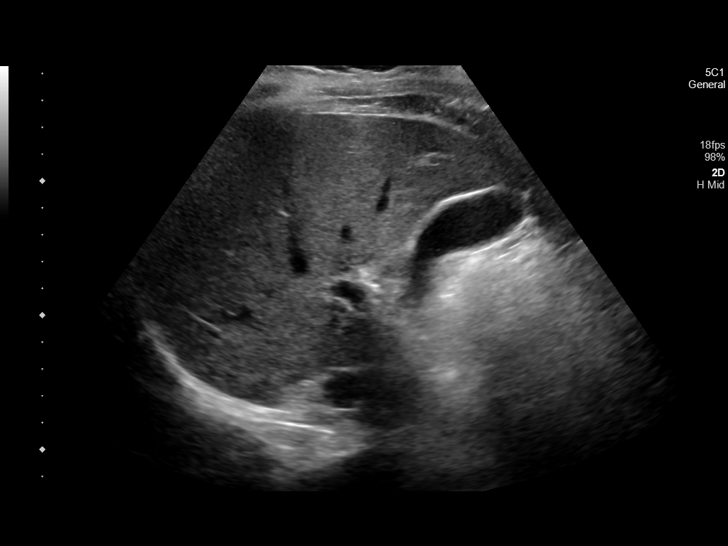
[im 55/66]
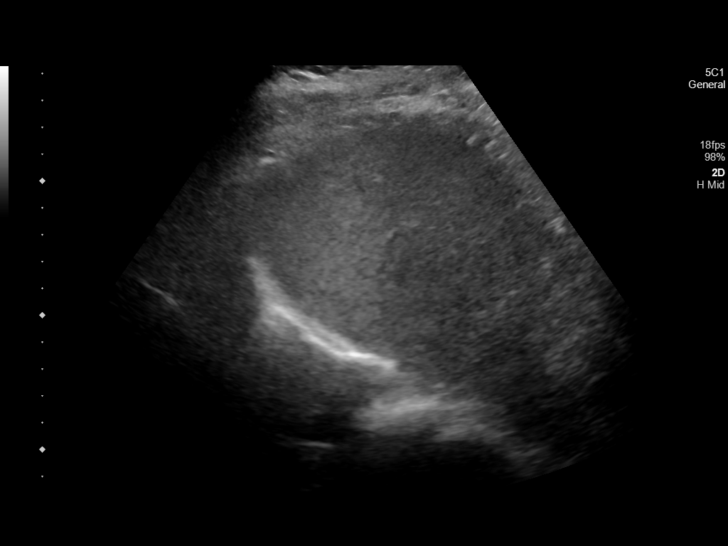
[im 60/66]
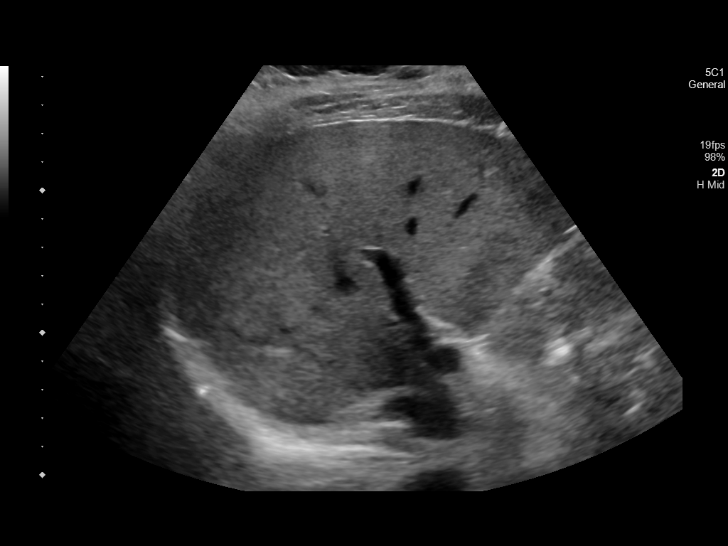
[im 66/66]
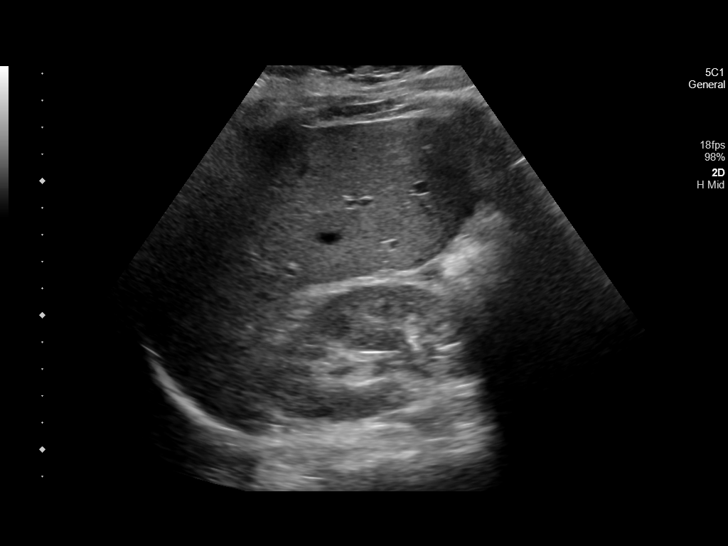

[14 of 25 positions shown; findings below may reference images not displayed]

FINDINGS: Gallbladder:

No gallstones or wall thickening visualized. No sonographic Murphy
sign noted by sonographer.

Common bile duct:

Diameter: 3 mm.

Liver:

No focal lesion identified. Within normal limits in parenchymal
echogenicity. Portal vein is patent on color Doppler imaging with
normal direction of blood flow towards the liver.

Other: None.
IMPRESSION: Unremarkable ultrasound of the right upper quadrant.

## 2022-06-11 NOTE — Addendum Note (Signed)
Addended by: Bradd Burner A on: 06/11/2022 03:45 PM   Modules accepted: Level of Service

## 2022-06-11 NOTE — Progress Notes (Signed)
Please change Office Visit code [phone] to [phone].  Dominica Severin, FNP-C

## 2022-07-02 NOTE — Addendum Note (Signed)
Addended by: Cletis Media on: 07/02/2022 03:07 PM   Modules accepted: Orders

## 2022-07-27 ENCOUNTER — Ambulatory Visit: Payer: BC Managed Care – PPO

## 2022-07-28 ENCOUNTER — Ambulatory Visit (LOCAL_COMMUNITY_HEALTH_CENTER): Payer: BLUE CROSS/BLUE SHIELD

## 2022-07-28 VITALS — BP 134/94 | Ht 63.0 in | Wt 231.5 lb

## 2022-07-28 DIAGNOSIS — Z3009 Encounter for other general counseling and advice on contraception: Secondary | ICD-10-CM

## 2022-07-28 DIAGNOSIS — Z308 Encounter for other contraceptive management: Secondary | ICD-10-CM | POA: Diagnosis not present

## 2022-07-28 DIAGNOSIS — Z3042 Encounter for surveillance of injectable contraceptive: Secondary | ICD-10-CM

## 2022-07-28 DIAGNOSIS — Z30013 Encounter for initial prescription of injectable contraceptive: Secondary | ICD-10-CM

## 2022-07-28 NOTE — Progress Notes (Signed)
11 weeks 5 days post depo.  Bp 134/94; rechecked about 10 minutes later and 153/97.  Takes amlodipine 5 mg daily and pt had bottle of medicine with her.  States she takes daily.  RN documented Bp readings and gave to pt; advised pt followup with Philis Pique.   Juleen China FNP re: elevated Bp and administration of depo and advised ok for depo and pt followup Bp.  Depo given IM RUOQ per order by K. House FNP dated 05/07/22; tolerated well.  Next depo due 10/13/22; appt reminder given.  Tonny Branch, RN

## 2022-10-20 ENCOUNTER — Ambulatory Visit: Payer: BLUE CROSS/BLUE SHIELD

## 2023-02-22 ENCOUNTER — Encounter: Payer: Self-pay | Admitting: Family Medicine

## 2023-02-22 ENCOUNTER — Other Ambulatory Visit: Payer: Self-pay

## 2023-02-22 ENCOUNTER — Ambulatory Visit: Payer: BLUE CROSS/BLUE SHIELD | Admitting: Family Medicine

## 2023-02-22 VITALS — BP 139/89 | HR 71 | Ht 63.0 in | Wt 228.0 lb

## 2023-02-22 DIAGNOSIS — R87615 Unsatisfactory cytologic smear of cervix: Secondary | ICD-10-CM

## 2023-02-22 DIAGNOSIS — Z5320 Procedure and treatment not carried out because of patient's decision for unspecified reasons: Secondary | ICD-10-CM

## 2023-02-22 DIAGNOSIS — Z308 Encounter for other contraceptive management: Secondary | ICD-10-CM

## 2023-02-22 DIAGNOSIS — Z30013 Encounter for initial prescription of injectable contraceptive: Secondary | ICD-10-CM

## 2023-02-22 DIAGNOSIS — Z113 Encounter for screening for infections with a predominantly sexual mode of transmission: Secondary | ICD-10-CM

## 2023-02-22 DIAGNOSIS — Z3009 Encounter for other general counseling and advice on contraception: Secondary | ICD-10-CM

## 2023-02-22 DIAGNOSIS — I10 Essential (primary) hypertension: Secondary | ICD-10-CM

## 2023-02-22 MED ORDER — MEDROXYPROGESTERONE ACETATE 150 MG/ML IM SUSP
150.0000 mg | INTRAMUSCULAR | Status: AC
  Administered 2023-02-22 – 2023-05-11 (×2): 150 mg via INTRAMUSCULAR

## 2023-02-22 NOTE — Progress Notes (Signed)

## 2023-02-22 NOTE — Progress Notes (Signed)
Depo given in LUOQ, reminder card given. All questions answered. Gaspar Garbe, RN

## 2023-02-22 NOTE — Progress Notes (Signed)
[facility] Adrienne Ad[facility]: [phone]  Family Planning Visit- Repeat Yearly Visit  Subjective:  Adrienne Adrienne is a pleasant 52 y.o. female [phone] being seen today for an annual wellness visit and to discuss contraception options.The patient is currently using Abstinence for pregnancy prevention. Patient does not want a pregnancy in the next year.   They report they are looking for a method that provides High efficacy at preventing pregnancy   Patient has the following medical problems: has Elevated blood pressure reading 03/10/21=148/98 and Obesity BMI=41.5 on their problem list.  Chief Complaint  Patient presents with   Contraception    Patient reports to clinic today to restart depo. She has no breast or GU concerns. The patient reports the same female only partner in the last year. She reports vaginal sex using condoms sometimes. She endorses her last sexual encounter was over two months ago and has had a normal period since then (02/16/23-6 days ago). She does state that since being on Depo her periods are more like spotting and very light, only noticeable on toilet paper when she wipes after using the restroom. She reports a history of Trich that was found on wet prep in November of 2022 (2 years ago). Of note, our records indicated no living children. This was corrected to show 4 living children as reported by the patient at today's visit.    See flowsheet for other program required questions.   Body mass index is 40.39 kg/m. - Patient is eligible for diabetes screening based on BMI> 25 and age >35?  yes HA1C ordered? Offered to patient, but declined today. Patient reports she is not aware of her PCP checking this for her. She was encouraged to have this performed at the next appointment here or with PCP.   Patient reports 1 of partners in last year. Desires STI screening?  No - Patient reports she will have  this completed at the next appointment.   Has patient been screened once for HCV in the past?  When patient asked she was not sure. Searching the record on file no results are found. Patient encouraged to have this performed at next visit as she is a candidate with MJ use and incarceration history.     Does the patient have current of drug use, have a partner with drug use, and/or has been incarcerated since last result? Yes  If yes-- Screen for HCV through [facility]   Does the patient meet criteria for HBV testing? Yes -  When patient asked if previous HBV testing completed she was not sure. Searching the record on file no results are found. Patient encouraged to have this performed at next visit as she is a candidate with MJ use and incarceration history.   Criteria:  -Household, sexual or needle sharing contact with HBV -History of drug use -HIV positive -Those with known Hep C   [facility] Maintenance Due  Topic Date Due   HIV Screening  Never done   Hepatitis C Screening  Never done   DTaP/Tdap/Td (1 - Tdap) Never done   Colonoscopy  Never done   Cervical Cancer Screening (HPV/Pap Cotest)  06/24/2019   INFLUENZA VACCINE  Never done   COVID-19 Vaccine (1 - 2023-24 season) Never done    Review of Systems  All other systems reviewed and are negative.   The following portions of the patient's history were reviewed and updated as appropriate: allergies, current  medications, past family history, past medical history, past social history, past surgical history and problem list. Problem list updated.  Objective:   Vitals:   02/22/23 0850  BP: 139/89  Pulse: 71  Weight: 228 lb (103.4 kg)  Height: 5\' 3"  (1.6 m)    Physical Exam Vitals and nursing note reviewed.  Constitutional:      Appearance: Normal appearance.  HENT:     Head: Normocephalic.     Mouth/Throat:     Lips: Pink. No lesions.  Eyes:     General:        Right eye: No discharge.        Left eye: No  discharge.  Pulmonary:     Effort: Pulmonary effort is normal.  Genitourinary:    Comments: Declined genital exam- no symptoms, self swabbed Musculoskeletal:        General: Normal range of motion.  Skin:    Comments: Exposed areas only. Skin tone appropriate for ethnicity.   Neurological:     Mental Status: She is alert and oriented to person, place, and time.  Psychiatric:        Attention and Perception: Attention normal.        Mood and Affect: Mood and affect normal.        Speech: Speech normal.        Behavior: Behavior normal. Behavior is cooperative.        Thought Content: Thought content normal.    Assessment and Plan:  MECHELE Adrienne is a 52 y.o. female [phone] presenting to the [facility] for an yearly wellness and contraception visit   Contraception counseling: Reviewed options based on patient desire and reproductive life plan. Patient is interested in Hormonal Injection. This was provided to the patient today.   Risks, benefits, and typical effectiveness rates were reviewed.  Questions were answered.  Written information was also given to the patient to review.    The patient will follow up in  3 months for surveillance.  The patient was told to call with any further questions, or with any concerns about this method of contraception.  Emphasized use of condoms 100% of the time for STI prevention.  Educated on ECP and assessed need for ECP. Patient was NOT offered ECP based on no unprotected sex in the last 5 days.   1. Family planning  - medroxyPROGESTERone (DEPO-PROVERA) injection 150 mg  2. Stage 2 hypertension Patient with elevated bp in clinic today. Patient has a history of diagnosed HTN. Discussed with patient and she reports she does have a PCP that is managing her bp, which she saw yesterday and bp was "normal". She reports she is on medication. Looking into the vital history, her bp has come down by about 20 points systolic over the  course of the records we have of bp on this patient.   3. Diabetes mellitus screening declined by patient Patient declines diabetes screening offered or performed by PCP. Offered in clinic today and patient declined. She was encouraged to have this performed in our office at next visit in about 3 months when she returns for next Depo injection or to schedule with her PCP. Patient verbalized understanding and agreed with this plan.    4. Screening for venereal disease Offered to patient in this clinic today. She declined and reported she would have testing including HBV/HCV performed at next visit in about 3 months when she returns for next Depo injection  5. Cervicovaginal cytology specimen  unsatisfactory Reviewing patient chart and previous results, it was found that in January of this year (10 months prior to this visit) a PAP was performed in this office and the sample was rejected by [facility] d/t specimen being placed in an expired vial. Patient states this was never relayed to her and she was unaware. Patient offered PAP today but declined. She was advised to have this done in this clinic at her next appointment in about 3 months when she returns for Depo injection. The patient verbalized understanding and agreed with plan.    LOS [id number] because time spent reviewing patient chart for history of STI screening (HBV/HCV/HIV), positive STI results, records of bp to determine HTN diagnosis and management by outside provider. Patient counseling performed and screening for STI and DM offered. Medication prescribed.   Return in about 3 months (around 05/25/2023) for Contraception injection.  No future appointments.  Edmonia James, NP

## 2023-05-11 ENCOUNTER — Ambulatory Visit: Payer: Self-pay | Admitting: Family Medicine

## 2023-05-11 VITALS — BP 139/92 | HR 84 | Ht 63.0 in | Wt 234.4 lb

## 2023-05-11 DIAGNOSIS — Z3009 Encounter for other general counseling and advice on contraception: Secondary | ICD-10-CM

## 2023-05-11 DIAGNOSIS — Z30013 Encounter for initial prescription of injectable contraceptive: Secondary | ICD-10-CM

## 2023-05-11 DIAGNOSIS — Z124 Encounter for screening for malignant neoplasm of cervix: Secondary | ICD-10-CM

## 2023-05-11 NOTE — Progress Notes (Signed)
   [facility]  Subjective:  Adrienne [name] is a 53 y.o. being seen today for   Chief Complaint  Patient presents with   Contraception   Annual Exam    Papsmear     HPI   Pt presents to clinic for pap smear and depo.  Health Maintenance Due  Topic Date Due   HIV Screening  Never done   Hepatitis C Screening  Never done   DTaP/Tdap/Td (1 - Tdap) Never done   Colonoscopy  Never done   Cervical Cancer Screening (HPV/Pap Cotest)  06/24/2019   INFLUENZA VACCINE  Never done   COVID-19 Vaccine (1 - 2024-25 season) Never done   MAMMOGRAM  Never done   Zoster Vaccines- Shingrix (1 of 2) Never done    ROS  The following portions of the patient's history were reviewed and updated as appropriate: allergies, current medications, past family history, past medical history, past social history, past surgical history and problem list. Problem list updated.   See flowsheet for other program required questions.  Objective:   Vitals:   05/11/23 0911  BP: (!) 139/92  Pulse: 84  Weight: 234 lb 6.4 oz (106.3 kg)  Height: 5\' 3"  (1.6 m)    Physical Exam Exam conducted with a chaperone present Marylou Sobers CMA).  Genitourinary:    General: Normal vulva.     Exam position: Lithotomy position.     Tanner stage (genital): 5.     Labia:        Right: No rash or tenderness.        Left: No rash or tenderness.      Vagina: Bleeding present. No vaginal discharge.     Cervix: Normal.     Comments: Difficulty with visualizing cervix      Assessment and Plan:  Adrienne [name] is a 53 y.o. female presenting to the [facility] Department for a Women's Health problem visit  1. Encounter for Papanicolaou smear of cervix (Primary)  - IGP, Aptima HPV     Return in about 1 year (around 05/10/2024) for annual well-woman exam.  No future appointments.  Earleen Glazier, [place]

## 2023-05-11 NOTE — Progress Notes (Signed)
 Pt is here for repeat Pap Test and Depo injection. Depo given at the [facility], reminder card and  Condoms given. Austine Lefort, RN.

## 2023-05-17 ENCOUNTER — Encounter: Payer: Self-pay | Admitting: Family Medicine

## 2023-05-17 LAB — IGP, APTIMA HPV
HPV Aptima: NEGATIVE
PAP Smear Comment: 0

## 2023-12-27 ENCOUNTER — Emergency Department

## 2023-12-27 ENCOUNTER — Emergency Department
Admission: EM | Admit: 2023-12-27 | Discharge: 2023-12-27 | Disposition: A | Discharge status: Home / Self Care | Attending: Emergency Medicine | Admitting: Emergency Medicine

## 2023-12-27 ENCOUNTER — Other Ambulatory Visit: Payer: Self-pay

## 2023-12-27 DIAGNOSIS — I10 Essential (primary) hypertension: Secondary | ICD-10-CM | POA: Diagnosis not present

## 2023-12-27 DIAGNOSIS — Z79899 Other long term (current) drug therapy: Secondary | ICD-10-CM | POA: Insufficient documentation

## 2023-12-27 DIAGNOSIS — R42 Dizziness and giddiness: Secondary | ICD-10-CM | POA: Diagnosis present

## 2023-12-27 LAB — CBC
HCT: 40.8 % (ref 36.0–46.0)
Hemoglobin: 13.7 g/dL (ref 12.0–15.0)
MCH: 31.6 pg (ref 26.0–34.0)
MCHC: 33.6 g/dL (ref 30.0–36.0)
MCV: 94.2 fL (ref 80.0–100.0)
Platelets: 207 K/uL (ref 150–400)
RBC: 4.33 MIL/uL (ref 3.87–5.11)
RDW: 13.3 % (ref 11.5–15.5)
WBC: 5.1 K/uL (ref 4.0–10.5)
nRBC: 0 % (ref 0.0–0.2)

## 2023-12-27 LAB — BASIC METABOLIC PANEL WITH GFR
Anion gap: 11 (ref 5–15)
BUN: 14 mg/dL (ref 6–20)
CO2: 25 mmol/L (ref 22–32)
Calcium: 9 mg/dL (ref 8.9–10.3)
Chloride: 103 mmol/L (ref 98–111)
Creatinine, Ser: 0.77 mg/dL (ref 0.44–1.00)
GFR, Estimated: >60 mL/min (ref >60)
Glucose, Bld: 133 mg/dL — ABNORMAL HIGH (ref 70–99)
Potassium: 3.5 mmol/L (ref 3.5–5.1)
Sodium: 139 mmol/L (ref 135–145)

## 2023-12-27 LAB — TROPONIN I (HIGH SENSITIVITY): Troponin I (High Sensitivity): 3 ng/L (ref <18)

## 2023-12-27 LAB — POC URINE PREG, ED: Preg Test, Ur: NEGATIVE

## 2023-12-27 MED ORDER — AMLODIPINE BESYLATE 5 MG PO TABS
5.0000 mg | ORAL_TABLET | Freq: Once | ORAL | Status: AC
  Administered 2023-12-27: 5 mg via ORAL
  Filled 2023-12-27: qty 1

## 2023-12-27 NOTE — ED Notes (Signed)
 Patient transported to CT

## 2023-12-27 NOTE — Discharge Instructions (Addendum)
 Your CT scan and blood work was reassuring however it is important that you follow-up with your primary care doctor to discuss your blood pressure and return the ER if you develop change in symptoms or worsening symptoms.  If you develop any dizziness at rest, nausea, vomiting, fevers, blurred vision that is not going away you need to return to the ER for repeat evaluation.  Otherwise you should call neurology and make an eye doctor appointment and return to the ER with any of the above concerns or any other concerns that you may have.

## 2023-12-27 NOTE — ED Triage Notes (Signed)
 Patient ambulatory to triage with complaints of HTN and dizziness. Patient states she wasn't feeling well at work and had someone check it. Noticed it was high so she came to ER. Takes amlodipine  5mg  once a day, due to take at 8am.

## 2023-12-27 NOTE — ED Provider Notes (Addendum)
 [facility] Provider Note    Event Date/Time   First MD Initiated Contact with Patient 12/27/23 0710     (approximate)   History   Hypertension and Dizziness   HPI  Adrienne [name] is a 53 y.o. female with hypertension who comes in with concerns for not feeling well.  Patient reports that she is not on any medications other than amlodipine  5 mg.  She reports that she is not taking it this morning secondary to she typically takes it at 8 AM.  She reports that she goes into work from 6 to 9 AM and then goes back at 2 PM.  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  She reports that prior to this she used to work in [employer] and that she did a lot of repetitive work which caused her to have some carpal tunnel in one of her hands.  She reports however over the past 2 days she has had these intermittent episodes of just not feeling well.  She reports that yesterday around 6 PM she had a little bit of a headache.  She denies it being the worst headache of her life or sudden and severe onset.  She reports that she has had headaches like this before but has been about over a year.  She states that she just feels like her job is too stressful.  She does report taking Tylenol  and the headache relieving.  She reports having some blurred vision on the left side but then it moved onto the right side and some bilateral tingling in her hands.  She currently denies any symptoms.  The vision blurriness can alternate over the past 2 days back-and-forth between the 2 sides.  She also reports no current tingling in her hands however in a few minutes she was said that the tingling could return to her both hands.  She denies any chest pain, shortness of breath, abdominal pain.  She reports that she just started working this job and she just feels very stressed by it but given her blood pressure was elevated she wanted to come in and make sure everything  was okay.  She denied chest pain, shortness of breath, abdominal pain or any current pain at this time. She has had ablation and denies concerns for pregnancy. Denies urinary symptoms. Denies any chest pain.  When I asked about the dizziness she mentioned in triage note, she states it wasn't really dizziness more lightheadedness with standing. Denies any at rest.   Physical Exam   Triage Vital Signs: ED Triage Vitals [12/27/23 0659]  Encounter Vitals Group     BP (!) 172/105     Girls Systolic BP Percentile      Girls Diastolic BP Percentile      Boys Systolic BP Percentile      Boys Diastolic BP Percentile      Pulse Rate 87     Resp 20     Temp 98.2 F (36.8 C)     Temp Source Oral     SpO2 95 %     Weight 230 lb (104.3 kg)     Height 5' 3 (1.6 m)     Head Circumference      Peak Flow      Pain Score 0     Pain Loc      Pain Education      Exclude from Growth Chart  Most recent vital signs: Vitals:   12/27/23 0659  BP: (!) 172/105  Pulse: 87  Resp: 20  Temp: 98.2 F (36.8 C)  SpO2: 95%     General: Awake, no distress.  CV:  Good peripheral perfusion.  Resp:  Normal effort.  Abd:  No distention.  Soft and nontender Other:  Finger-nose intact bilaterally.  Sensation intact throughout.  No current tingling.  No vision changes.  Peripheral vision intact.  Cranial nerves II through XII are intact.  Patient is able to ambulate.  No ataxia.   ED Results / Procedures / Treatments   Labs (all labs ordered are listed, but only abnormal results are displayed) Labs Reviewed  BASIC METABOLIC PANEL WITH GFR - Abnormal; Notable for the following components:      Result Value   Glucose, Bld 133 (*)    All other components within normal limits  CBC  POC URINE PREG, ED  TROPONIN I (HIGH SENSITIVITY)     EKG  My interpretation of EKG:  Normal sinus rhythm 89 without any ST elevation or T wave inversions, normal intervals  RADIOLOGY I have reviewed the xray  personally and interpreted no evidence of any pneumonia   PROCEDURES:  Critical Care performed: No  Procedures   MEDICATIONS ORDERED IN ED: Medications  amLODipine  (NORVASC ) tablet 5 mg (5 mg Oral Given 12/27/23 0742)     IMPRESSION / MDM / ASSESSMENT AND PLAN / ED COURSE  I reviewed the triage vital signs and the nursing notes.   Patient's presentation is most consistent with acute presentation with potential threat to life or bodily function.   Patient comes in with multiple symptoms in the setting of concern for elevated blood pressure.  Patient's neurological exam is reassuring no cranial nerve deficits she is slightly elevated in triage but I went to evaluate patient her blood pressure was in the 140s systolic.  This was a without any interventions.  I will order her her home dose of amlodipine .  Her symptoms switching between the different eyes and being on both hands does not sound to be the typical pattern of a stroke.  We discussed CT imaging to evaluate for any intracranial hemorrhage, mass.  However she reports having headaches like this previously was not the worst headache of her life to suggest subarachnoid.  Her troponin was negative.  Her BMP shows normal kidney function and her CBC is reassuring.  Initially patient had declined CT imaging and I have low suspicion for subarachnoid but I again did discuss with patient and reviewed her chart she has had no prior history of CT imaging of her head and she is at least willing to do a CT scan to evaluate for any type of mass,intraparenchymal hemorrhage or other acute pathology.  IMPRESSION: 1. No acute intracranial abnormality.   8:57 AM Repeat evaluation patient reports feeling improved.  She denies any current dizziness, blurred vision, tingling or other concerns.  She has been up and ambulatory without any ataxia.  I did check a heel-to-shin since I did not do that earlier and it was normal.  We discussed MRI to rule out  stroke but at this time I very low suspicion given reassuring workup we did discuss return precautions in regards to this her repeat blood sugar pressure has come down after initiation of her amlodipine  and she feels improved.  She will follow-up outpatient I will give her her neurology's number to follow-up with outpatient and to return to the ER if  she develops any worsening symptoms or any other concerns peer   Do not feel we need a repeat troponin given symptoms had started yesterday so is over 3 hours and patient is low risk and she denied any chest pain or shortness of breath  FINAL CLINICAL IMPRESSION(S) / ED DIAGNOSES   Final diagnoses:  Hypertension, unspecified type     Rx / DC Orders   ED Discharge Orders     None        Note:  This document was prepared using Dragon voice recognition software and may include unintentional dictation errors.   Ernest Ronal BRAVO, MD 12/27/23 9140    Ernest Ronal BRAVO, MD 12/27/23 [phone]

## 2024-01-13 ENCOUNTER — Other Ambulatory Visit: Payer: Self-pay | Admitting: Family Medicine

## 2024-01-13 DIAGNOSIS — Z1231 Encounter for screening mammogram for malignant neoplasm of breast: Secondary | ICD-10-CM
